# Patient Record
Sex: Male | Born: 1980
Health system: Southern US, Community
[De-identification: ages and names within clinical notes are randomized; demographics above are authoritative.]

## PROBLEM LIST (undated history)

## (undated) DIAGNOSIS — F418 Other specified anxiety disorders: Secondary | ICD-10-CM

## (undated) DIAGNOSIS — R3129 Other microscopic hematuria: Secondary | ICD-10-CM

## (undated) DIAGNOSIS — I493 Ventricular premature depolarization: Secondary | ICD-10-CM

## (undated) DIAGNOSIS — I10 Essential (primary) hypertension: Secondary | ICD-10-CM

## (undated) DIAGNOSIS — R002 Palpitations: Secondary | ICD-10-CM

## (undated) HISTORY — DX: Essential (primary) hypertension: I10

## (undated) HISTORY — DX: Palpitations: R00.2

## (undated) HISTORY — DX: Other microscopic hematuria: R31.29

## (undated) HISTORY — DX: Ventricular premature depolarization: I49.3

## (undated) HISTORY — DX: Other specified anxiety disorders: F41.8

---

## 2008-12-10 ENCOUNTER — Emergency Department (HOSPITAL_COMMUNITY): Admission: EM | Admit: 2008-12-10 | Discharge: 2008-12-10 | Payer: Self-pay | Admitting: Emergency Medicine

## 2009-07-24 ENCOUNTER — Emergency Department (HOSPITAL_COMMUNITY): Admission: EM | Admit: 2009-07-24 | Discharge: 2009-07-25 | Payer: Self-pay | Admitting: Emergency Medicine

## 2009-07-31 ENCOUNTER — Ambulatory Visit: Payer: Self-pay | Admitting: Internal Medicine

## 2009-07-31 ENCOUNTER — Inpatient Hospital Stay (HOSPITAL_COMMUNITY): Admission: EM | Admit: 2009-07-31 | Discharge: 2009-08-05 | Payer: Self-pay | Admitting: Emergency Medicine

## 2009-08-01 ENCOUNTER — Encounter (INDEPENDENT_AMBULATORY_CARE_PROVIDER_SITE_OTHER): Payer: Self-pay | Admitting: Internal Medicine

## 2009-08-05 ENCOUNTER — Encounter: Payer: Self-pay | Admitting: Internal Medicine

## 2009-09-09 ENCOUNTER — Encounter: Payer: Self-pay | Admitting: Internal Medicine

## 2009-09-09 ENCOUNTER — Ambulatory Visit: Payer: Self-pay | Admitting: Internal Medicine

## 2009-09-09 DIAGNOSIS — I10 Essential (primary) hypertension: Secondary | ICD-10-CM

## 2009-09-09 DIAGNOSIS — F341 Dysthymic disorder: Secondary | ICD-10-CM

## 2009-09-09 LAB — CONVERTED CEMR LAB
Bacteria, UA: NONE SEEN
Glucose, Urine, Semiquant: NEGATIVE
Ketones, urine, test strip: NEGATIVE
Leukocytes, UA: NEGATIVE
Nitrite: NEGATIVE
Nitrite: NEGATIVE
Protein, U semiquant: NEGATIVE
RBC / HPF: NONE SEEN (ref <3)
Specific Gravity, Urine: >=1.03
Specific Gravity, Urine: 1.028 (ref 1.005–1.0)
Urine Glucose: NEGATIVE mg/dL
WBC Urine, dipstick: NEGATIVE
pH: 5.5 (ref 5.0–8.0)

## 2009-09-10 DIAGNOSIS — R3129 Other microscopic hematuria: Secondary | ICD-10-CM

## 2009-11-05 ENCOUNTER — Telehealth: Payer: Self-pay | Admitting: *Deleted

## 2009-11-06 ENCOUNTER — Ambulatory Visit (HOSPITAL_COMMUNITY): Admission: RE | Admit: 2009-11-06 | Discharge: 2009-11-06 | Payer: Self-pay | Admitting: Infectious Diseases

## 2009-11-06 ENCOUNTER — Ambulatory Visit: Payer: Self-pay | Admitting: Infectious Diseases

## 2009-11-06 ENCOUNTER — Encounter: Payer: Self-pay | Admitting: Internal Medicine

## 2009-11-06 DIAGNOSIS — I4949 Other premature depolarization: Secondary | ICD-10-CM

## 2009-11-19 ENCOUNTER — Encounter: Payer: Self-pay | Admitting: Licensed Clinical Social Worker

## 2009-11-20 ENCOUNTER — Encounter: Payer: Self-pay | Admitting: Internal Medicine

## 2009-11-20 ENCOUNTER — Ambulatory Visit (HOSPITAL_COMMUNITY): Admission: RE | Admit: 2009-11-20 | Discharge: 2009-11-20 | Payer: Self-pay | Admitting: Internal Medicine

## 2009-11-20 ENCOUNTER — Ambulatory Visit: Payer: Self-pay | Admitting: Internal Medicine

## 2009-12-08 ENCOUNTER — Telehealth: Payer: Self-pay | Admitting: Internal Medicine

## 2010-03-02 ENCOUNTER — Encounter: Payer: Self-pay | Admitting: Internal Medicine

## 2010-03-02 ENCOUNTER — Ambulatory Visit: Payer: Self-pay | Admitting: Internal Medicine

## 2010-03-02 ENCOUNTER — Ambulatory Visit (HOSPITAL_COMMUNITY): Admission: RE | Admit: 2010-03-02 | Discharge: 2010-03-02 | Payer: Self-pay | Admitting: Internal Medicine

## 2010-03-02 DIAGNOSIS — K649 Unspecified hemorrhoids: Secondary | ICD-10-CM | POA: Insufficient documentation

## 2010-03-04 ENCOUNTER — Telehealth: Payer: Self-pay | Admitting: *Deleted

## 2010-03-10 ENCOUNTER — Encounter: Payer: Self-pay | Admitting: Internal Medicine

## 2010-03-10 ENCOUNTER — Ambulatory Visit: Payer: Self-pay | Admitting: Internal Medicine

## 2010-03-12 ENCOUNTER — Ambulatory Visit: Payer: Self-pay | Admitting: Internal Medicine

## 2010-03-31 ENCOUNTER — Telehealth: Payer: Self-pay | Admitting: Internal Medicine

## 2010-04-03 ENCOUNTER — Telehealth: Payer: Self-pay | Admitting: Internal Medicine

## 2010-04-16 ENCOUNTER — Ambulatory Visit: Payer: Self-pay | Admitting: Internal Medicine

## 2010-04-21 ENCOUNTER — Telehealth: Payer: Self-pay | Admitting: Internal Medicine

## 2010-05-04 ENCOUNTER — Telehealth: Payer: Self-pay | Admitting: Internal Medicine

## 2010-05-11 ENCOUNTER — Ambulatory Visit: Payer: Self-pay | Admitting: Internal Medicine

## 2010-12-29 NOTE — Assessment & Plan Note (Signed)
Summary: 4:15./PER CHECK OUT/SAF   Visit Type:  Follow-up Primary Provider:  Darnelle Maffucci MD   History of Present Illness: Vincent Griffith returns today for followup of his symptomatic PVC's.  He is a 30 yo man who has had PVC's which have been refractory to medical therapy in the past.  He has never had syncope and his LV function has been preserved in the past.  The patient was seen by me several weeks ago and at that time I recommended a trial of flecainide.  He has had no improvement in his symptoms and he has had bad dreams on flecainide and frequent urination.  No other complaints at this time except that he feels bad.  He states that he is too weak to do anything strenuous.  Current Medications (verified): 1)  Bisoprolol Fumarate 5 Mg Tabs (Bisoprolol Fumarate) .... Take 1/2 Tablet By Mouth Twice A Day 2)  Flecainide Acetate 50 Mg Tabs (Flecainide Acetate) .... Take 1/2 Tablet By Mouth Every 12 Hours  Allergies (verified): No Known Drug Allergies  Past History:  Past Medical History: Last updated: 12/31/2009 Current Problems:  INADEQUATE MATERIAL RESOURCES (ICD-V60.2) PREMATURE VENTRICULAR CONTRACTIONS, FREQUENT (ICD-427.69) MICROSCOPIC HEMATURIA (ICD-599.72) PALPITATIONS (ICD-785.1) ESSENTIAL HYPERTENSION, BENIGN (ICD-401.1) ANXIETY DEPRESSION (ICD-300.4) FAMILY HISTORY DIABETES 1ST DEGREE RELATIVE (ICD-V18.0)    Family History: Last updated: 09/09/2009 Family History Diabetes 1st degree relative Family History Hypertension  Review of Systems       The patient complains of chest pain and dyspnea on exertion.  The patient denies syncope and peripheral edema.    Vital Signs:  Patient profile:   30 year old male Height:      68.5 inches Weight:      202 pounds BMI:     30.38 Pulse rate:   80 / minute BP sitting:   120 / 70  (left arm)  Vitals Entered By: Laurance Flatten CMA (Apr 16, 2010 4:23 PM)  Physical Exam  General:  alert, well-developed, and cooperative to  examination.    Head:  normocephalic and atraumatic.   Eyes:  vision grossly intact, pupils equal, pupils round, and pupils reactive to light.   Mouth:  good dentition and pharynx pink and moist.   Neck:  No deformities, masses, or tenderness noted. Lungs:  normal respiratory effort, no accessory muscle use, normal breath sounds, no crackles, and no wheezes.  Heart:  normal rate, regular rhythm, no murmur, no gallop, and no rub.    Abdomen:  soft, non-tender, normal bowel sounds, no distention, no guarding, no rebound tenderness, no hepatomegaly, and no splenomegaly.    Msk:  no joint swelling, no joint warmth, and no redness over joints.    Pulses:  2+ Extremities:  No cyanosis, clubbing, edema  Neurologic:  alert & oriented X3, cranial nerves II-XII intact, strength normal in all extremities, sensation intact to light touch, and gait normal.       EKG  Procedure date:  04/16/2010  Findings:      Normal sinus rhythm with rate of: 69. PVC's noted which are quite narrow with a RBBB pattern along with a right inferior axis.    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  I have discussed the treatment options with him.  Catheter ablation could be currative and I have recommended this approach.  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  At this time the patient would like to try one additional medication which he stipulates needs to be taken only once daily.  Amiodarone is the only drug I can think of which might be effective and we will try this.  If he fails this medicine, then ablation would be recommended. The following medications were removed from the medication list:    Bisoprolol Fumarate 5 Mg Tabs (Bisoprolol fumarate) .Marland Kitchen...  Take 1/2 tablet by mouth twice a day    Flecainide Acetate 50 Mg Tabs (Flecainide acetate) .Marland Kitchen... Take 1/2 tablet by mouth every 12 hours His updated medication list for this problem includes:    Amiodarone Hcl 200 Mg Tabs (Amiodarone hcl) .Marland Kitchen... Take one tablet by mouth daily  Problem # 2:  ESSENTIAL HYPERTENSION, BENIGN (ICD-401.1) His blood pressure has been fairly well controlled.  Continue his current meds.  The following medications were removed from the medication list:    Bisoprolol Fumarate 5 Mg Tabs (Bisoprolol fumarate) .Marland Kitchen... Take 1/2 tablet by mouth twice a day  Patient Instructions: 1)  Your physician recommends that you schedule a follow-up appointment in: 3 months with Dr. Delorise Jackson 2)  Your physician has recommended you make the following change in your medication: Amiodarone 200mg  1 tablet daily.   STOP Flecainide and STOP Bisoprolol Prescriptions: AMIODARONE HCL 200 MG TABS (AMIODARONE HCL) Take one tablet by mouth daily  #30 x 60   Entered by:   Lisabeth Devoid RN   Authorized by:   Laren Boom, MD, [facility]   Signed by:   Lisabeth Devoid RN on 04/16/2010   Method used:   Print then Give to Patient   RxID:   [id number]

## 2010-12-29 NOTE — Progress Notes (Signed)
Summary: clarification/gp  Phone Note From Pharmacy   Caller: [facility] [address] Summary of Call: Fax from [facility] pharmacy needs clarification  for Cortomycin  suspension.  It states this med. is ear drops and wants to know if you still want pt. to have this for hemorrhoid pain.  Thanks Initial call taken by: Chinita Pester RN,  Apr 03, 2010 2:29 PM  Follow-up for Phone Call        obviously an error! I will correct the prescription! will resend. Follow-up by: Julaine Fusi  DO,  Apr 06, 2010 11:28 PM    New/Updated Medications: HYDROCORTISONE ACE-PRAMOXINE 2.5-1 % CREA (PRAMOXINE-HC) Apply to affected area twice daily as directed Prescriptions: HYDROCORTISONE ACE-PRAMOXINE 2.5-1 % CREA (PRAMOXINE-HC) Apply to affected area twice daily as directed  #1 tube x 2   Entered and Authorized by:   Julaine Fusi  DO   Signed by:   Julaine Fusi  DO on 04/06/2010   Method used:   Electronically to        [facility] [address] (retail)       [phone]       [place], [place]  [address]       Ph: [phone]       Fax: [phone]   RxID:   [id number]

## 2010-12-29 NOTE — Progress Notes (Signed)
Summary: Hemmorrhoid  Phone Note Call from Patient   Caller: Patient Summary of Call: Pt here says that the medication he received for his hemmorrhoids is not working.  Wants something called Neo-healer.  said that he has used this before.  OTC medications have not been helping.Angelina Ok RN  Mar 31, 2010 4:36 PM    Initial call taken by: Angelina Ok RN,  Mar 31, 2010 4:38 PM    New/Updated Medications: CORTOMYCIN 3.5-10000-1 SUSP [facility]) Use one susp daily as needed for hemmorhoid pain Prescriptions: CORTOMYCIN 3.5-10000-1 SUSP (NEOMYCIN-POLYMYXIN-HC) Use one susp daily as needed for hemmorhoid pain  #10 x 2   Entered and Authorized by:   Julaine Fusi  DO   Signed by:   Julaine Fusi  DO on 03/31/2010   Method used:   Electronically to        [facility] [address] (retail)       [phone]       [place], [place]  [address]       Ph: [phone]       Fax: [phone]   RxID:   [id number]

## 2010-12-29 NOTE — Progress Notes (Signed)
Summary: med refill/gp  Phone Note Refill Request Message from:  Patient on December 08, 2009 12:03 PM  Refills Requested: Medication #1:  ATENOLOL 50 MG TABS Take 1 tablet by mouth once a day.   Dosage confirmed as above?Dosage Confirmed   Supply Requested: 1 year  Method Requested: Electronic Initial call taken by: Chinita Pester RN,  December 08, 2009 12:03 PM    Prescriptions: ATENOLOL 50 MG TABS (ATENOLOL) Take 1 tablet by mouth once a day  #30 x 11   Entered and Authorized by:   Doneen Poisson MD   Signed by:   Doneen Poisson MD on 12/08/2009   Method used:   Electronically to        [facility] [address] (retail)       [phone]       [place], [place]  [address]       Ph: [phone]       Fax: [phone]   RxID:   [id number]

## 2010-12-29 NOTE — Progress Notes (Signed)
Summary: sided effect from med-GT  Phone Note Call from Patient Call back at Home Phone [phone]   Caller: Patient Reason for Call: Talk to Nurse Summary of Call: per pt calling, gt prescribe AMIODARONE HCL 200 MG TABS Take one tablet by mouth daily. pt stop taken meds b/c of sided effect. heart skipping beat.  Initial call taken by: Lorne Skeens,  May 04, 2010 4:42 PM  Follow-up for Phone Call        Will discuss with Dr Ladona Ridgel tomorrow.  Stopped Amiodarone 2 weeks ago, he feels ok, heart is skipping all the time, [place] Dennis Bast, RN, BSN  May 04, 2010 5:36 PM spoke with Dr Ladona Ridgel it's ok not to take and return to his country.  PVC's over a long period of time may cause his EF to decrease.  LMOM for pt Dennis Bast, RN, BSN  May 05, 2010 2:34 PM

## 2010-12-29 NOTE — Progress Notes (Signed)
Summary: med change/gp  Phone Note From Pharmacy   Caller: [facility] Summary of Call: [facility] does not have Anusol cream and wants to know if it can be substituted for Hydrocortisine 2.5% cream.  I asked Dr. Gilford Rile and he said it is o.k. [facility] pharmacy made awared. Initial call taken by: Chinita Pester RN,  March 04, 2010 2:53 PM

## 2010-12-29 NOTE — Assessment & Plan Note (Signed)
Summary: nep   Visit Type:  Follow-up Primary Provider:  Darnelle Maffucci MD   History of Present Illness: Vincent Griffith is referred today by Dr. Clent Ridges for evaluation of symptomatic palpitations secondary to frequent ventricular ectopy.  The patient is a pleasant 30 yo man from [place] who has lived in the [place] for 10 yrs.  He has had palpitations for several months.  He also suffers from depression.  He has been on multiple beta blockers in the past and has had problems with constipation.  The patient notes that his beta blockers improve but do not eliminate his symptoms.  He also has some fatigue and weakness.  He has never had syncope and there is not an exertional component.    Current Medications (verified): 1)  Bisoprolol Fumarate 5 Mg Tabs (Bisoprolol Fumarate) .... 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  Vital Signs:  Patient profile:   30 year old male Height:      68.5 inches Weight:      200 pounds BMI:     30.08 Pulse rate:   81 / minute BP sitting:   120 / 80  (left arm)  Vitals Entered By: Laurance Flatten CMA (March 12, 2010 11:37 AM)  Physical Exam  General:  alert, well-developed, and cooperative to examination.    Head:  normocephalic and atraumatic.   Eyes:  vision grossly intact, pupils equal, pupils round, and pupils reactive to light.   Mouth:  good dentition and pharynx pink and moist.   Neck:  No deformities, masses, or tenderness noted. Lungs:  normal  respiratory effort, no accessory muscle use, normal breath sounds, no crackles, and no wheezes.  Heart:  normal rate, regular rhythm, no murmur, no gallop, and no rub.    Abdomen:  soft, non-tender, normal bowel sounds, no distention, no guarding, no rebound tenderness, no hepatomegaly, and no splenomegaly.    Msk:  no joint swelling, no joint warmth, and no redness over joints.    Pulses:  2+ Extremities:  No cyanosis, clubbing, edema  Neurologic:  alert & oriented X3, cranial nerves II-XII intact, strength normal in all extremities, sensation intact to light touch, and gait normal.       EKG  Procedure date:  03/12/2010  Findings:      Normal sinus rhythm with rate of: 81.  PVC's noted.  Left axis deviation.    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  We may ultimately need more flecainide but will start him on low dose.  Once we know his dose requirements, will schedule an exercise test. His updated medication list for this problem includes:    Bisoprolol Fumarate 5 Mg Tabs (Bisoprolol fumarate) .Marland Kitchen... Take 1/2 tablet by mouth twice a day    Flecainide Acetate 50 Mg Tabs (Flecainide acetate) .Marland Kitchen... Take one tablet by mouth every 12 hours  Problem # 2:  PALPITATIONS (ICD-785.1) This is secondary to problem number one.  Will followup. His updated  medication list for this problem includes:    Bisoprolol Fumarate 5 Mg Tabs (Bisoprolol fumarate) .Marland Kitchen... Take 1/2 tablet by mouth twice a day    Flecainide Acetate 50 Mg Tabs (Flecainide acetate) .Marland Kitchen... Take one tablet by mouth every 12 hours  Problem # 3:  ESSENTIAL HYPERTENSION, BENIGN (ICD-401.1) He will continue his current meds and maintain a low sodium diet. His updated medication list for this problem includes:    Bisoprolol Fumarate 5 Mg Tabs (Bisoprolol fumarate) .Marland Kitchen... Take 1/2 tablet  by mouth twice a day  Patient Instructions: 1)  Your physician has recommended you make the following change in your medication: Stay on Bisoprolol 5mg  1/2  tablet twice daily, Start Flecainide 50mg  1 tablet twice daily. 2)  Your physician recommends that you schedule a follow-up appointment in: 4-6 weeks with Dr Ladona Ridgel. Prescriptions: BISOPROLOL FUMARATE 5 MG TABS (BISOPROLOL FUMARATE) Take 1/2 tablet by mouth twice a day  #30 x 11   Entered by:   Proofreader   Authorized by:   Laren Boom, MD, [facility]   Signed by:   Gypsy Balsam RN BSN on 03/12/2010   Method used:   Print then Give to Patient   RxID:   [id number] FLECAINIDE ACETATE 50 MG TABS (FLECAINIDE ACETATE) Take one tablet by mouth every 12 hours  #60 x 11   Entered by:   Proofreader   Authorized by:   Laren Boom, MD, [facility]   Signed by:   Gypsy Balsam RN BSN on 03/12/2010   Method used:   Print then Give to Patient   RxID:   [id number]

## 2010-12-29 NOTE — Assessment & Plan Note (Signed)
Summary: est-ck/fu/meds/cfb   Vital Signs:  Patient profile:   30 year old male Height:      68.5 inches (173.99 cm) Weight:      201.9 pounds (91.77 kg) BMI:     30.36 Temp:     98.4 degrees F (36.89 degrees C) oral Pulse rate:   68 / minute BP sitting:   105 / 67  (right arm) Cuff size:   large  Vitals Entered By: Cynda Familia Duncan Dull) (May 11, 2010 10:09 AM) CC: routine f/u, was recently seen in by Selena Batten, Depression Is Patient Diabetic? No Pain Assessment Patient in pain? no      Nutritional Status BMI of > 30 = obese  Have you ever been in a relationship where you felt threatened, hurt or afraid?No   Does patient need assistance? Functional Status Self care Ambulation Normal   Primary Care Provider:  Darnelle Maffucci MD  CC:  routine f/u, was recently seen in by [facility], and Depression.  History of Present Illness: This is a 30 year old male with past medical history of HTN, anxiety and recent onset PVC's.  He was hospitalized in 07/2009 for severe dyspnia and had a completely normal work up at that time.  Dyspnia was attributed to anxiety and he was started on buspirone and metroprol.  He was seen earlier this month in this clinic and at that time was complaining of palpitations.    Today he is here for a follow up and   1) c/o of ongoing hemorrhoidal pain, requests new medication for that.  2) pt was seen by dr. Ladona Ridgel and dr. Ladona Ridgel recommened ablation for the patient, and patient is asking me about the risks and benefits.   3) c/o anxiety and insomnia  Pt denies fever, chills, or any other complaints, and otherwise doing well.   Depression History:      The patient is having a depressed mood most of the day but denies diminished interest in his usual daily activities.  The patient denies significant weight loss, significant weight gain, insomnia, hypersomnia, psychomotor agitation, psychomotor retardation, fatigue (loss of energy),  feelings of worthlessness (guilt), impaired concentration (indecisiveness), and recurrent thoughts of death or suicide.        The patient denies that he has thought about ending his life.         Preventive Screening-Counseling & Management  Alcohol-Tobacco     Alcohol drinks/day: 0     Smoking Status: current     Smoking Cessation Counseling: yes     Packs/Day: 0.5  Current Medications (verified): 1)  Amiodarone Hcl 200 Mg Tabs (Amiodarone Hcl) .... Take One Tablet By Mouth Daily 2)  Ativan 1 Mg Tabs (Lorazepam) .... Take 1 Tablet By Mouth Two Times A Day As Needed For Anxiety. 3)  Hemorrhoidal Relief 0.25-3-85.5 % Supp (Pe-Shark Liver Oil-Cocoa Buttr) .... Apply 1 Suppository Per Rectum Twice Daily. 4)  Colace 100 Mg Caps (Docusate Sodium) .... Take 1 Tablet By Mouth Two Times A Day  Allergies (verified): No Known Drug Allergies  Social History: Packs/Day:  0.5  Review of Systems       Per HPI  Physical Exam  General:  alert, well-developed, and cooperative to examination.    Neck:  No deformities, masses, or tenderness noted. Lungs:  normal respiratory effort, no accessory muscle use, normal breath sounds, no crackles, and no wheezes.  Heart:  normal rate, regular rhythm, no murmur, no gallop, and no rub.  Abdomen:  soft, non-tender, normal bowel sounds, no distention, no guarding, no rebound tenderness, no hepatomegaly, and no splenomegaly.    Msk:  no joint swelling, no joint warmth, and no redness over joints.    Pulses:  2+ Extremities:  No cyanosis, clubbing, edema  Neurologic:  alert & oriented X3, cranial nerves II-XII intact, strength normal in all extremities, sensation intact to light touch, and gait normal.     Skin:   turgor normal and no rashes.  Psych:  Oriented X3, memory intact for recent and remote, normally interactive, and slightly anxious.     Impression & Recommendations:  Problem # 1:  PREMATURE VENTRICULAR CONTRACTIONS, FREQUENT  (ICD-427.69) Assessment Comment Only per Dr. Ladona Ridgel.  His updated medication list for this problem includes:    Amiodarone Hcl 200 Mg Tabs (Amiodarone hcl) .Marland Kitchen... Take one tablet by mouth daily    Bisoprolol Fumarate 5 Mg Tabs (Bisoprolol fumarate) .Marland Kitchen... Take 1/2 tablet by mouth twice daily.  Problem # 2:  HEMORRHOIDS (ICD-455.6) Assessment: Comment Only pain c/o of ongoing pain, will give supp for pain, and colace.   Problem # 3:  ESSENTIAL HYPERTENSION, BENIGN (ICD-401.1) Assessment: Comment Only  Well controlled on current treatment, No new changes made today, Will continue to monitor.   His updated medication list for this problem includes:    Bisoprolol Fumarate 5 Mg Tabs (Bisoprolol fumarate) .Marland Kitchen... Take 1/2 tablet by mouth twice daily.  Problem # 4:  ANXIETY DEPRESSION (ICD-300.4) Assessment: Comment Only will start trial of ativan.   Complete Medication List: 1)  Amiodarone Hcl 200 Mg Tabs (Amiodarone hcl) .... Take one tablet by mouth daily 2)  Ativan 1 Mg Tabs (Lorazepam) .... Take 1 tablet by mouth two times a day as needed for anxiety. 3)  Hemorrhoidal Relief 0.25-3-85.5 % Supp (Pe-shark liver oil-cocoa buttr) .... Apply 1 suppository per rectum twice daily. 4)  Colace 100 Mg Caps (Docusate sodium) .... Take 1 tablet by mouth two times a day 5)  Bisoprolol Fumarate 5 Mg Tabs (Bisoprolol fumarate) .... Take 1/2 tablet by mouth twice daily.  Patient Instructions: 1)  Please schedule a follow-up appointment in 3 months. Prescriptions: BISOPROLOL FUMARATE 5 MG TABS (BISOPROLOL FUMARATE) take 1/2 tablet by mouth twice daily.  #30 x 1   Entered and Authorized by:   Darnelle Maffucci MD   Signed by:   Darnelle Maffucci MD on 05/11/2010   Method used:   Print then Give to Patient   RxID:   [id number] ATIVAN 1 MG TABS (LORAZEPAM) Take 1 tablet by mouth two times a day as needed for anxiety.  #20 x 0   Entered and Authorized by:   Darnelle Maffucci MD   Signed by:   Darnelle Maffucci MD on 05/11/2010   Method used:   Print then Give to Patient   RxID:   [id number] COLACE 100 MG CAPS (DOCUSATE SODIUM) Take 1 tablet by mouth two times a day  #60 x 0   Entered and Authorized by:   Darnelle Maffucci MD   Signed by:   Darnelle Maffucci MD on 05/11/2010   Method used:   Print then Give to Patient   RxID:   [id number] HEMORRHOIDAL RELIEF 0.25-3-85.5 % SUPP (PE-SHARK LIVER OIL-COCOA BUTTR) apply 1 suppository per rectum twice daily.  #20 x 0   Entered and Authorized by:   Darnelle Maffucci MD   Signed by:   Darnelle Maffucci MD on 05/11/2010   Method used:   Print then Give to  Patient   RxID:   [id number] ATIVAN 1 MG TABS (LORAZEPAM) Take 1 tablet by mouth two times a day as needed for anxiety.  #20 x 0   Entered and Authorized by:   Darnelle Maffucci MD   Signed by:   Darnelle Maffucci MD on 05/11/2010   Method used:   Print then Give to Patient   RxID:   [id number]

## 2010-12-29 NOTE — Progress Notes (Signed)
Summary: question regarding meds -sided effect  Phone Note Call from Patient Call back at Home Phone [phone]   Refills Requested: Medication #1:  mouth daily. Caller: Patient Reason for Call: Talk to Nurse Summary of Call: per pt calling, pt on AMIODARONE HCL 200 MG TABS Take one tablet by . has question regarding side effect.  Initial call taken by: Lorne Skeens,  Apr 21, 2010 3:03 PM  Follow-up for Phone Call        has questions about the Amiodarone and its side effects.  Feels in four days of taking it is making him constipated.  He is going to continue to try and take and will call us if problems persist Dennis Bast, RN, BSN  Apr 21, 2010 3:48 PM

## 2010-12-29 NOTE — Assessment & Plan Note (Signed)
Summary: EST-REFILL ON MEDS/CH   Vital Signs:  Patient profile:   30 year old male Height:      68.5 inches (173.99 cm) Weight:      198.1 pounds (90.05 kg) BMI:     29.79 Temp:     97.9 degrees F (36.61 degrees C) oral Pulse rate:   75 / minute BP sitting:   127 / 74  (left arm) Cuff size:   regular  Vitals Entered By: Cynda Familia Duncan Dull) (March 02, 2010 4:02 PM) CC: problems taking buspirone 5mg  Is Patient Diabetic? No Pain Assessment Patient in pain? no      Nutritional Status BMI of 25 - 29 = overweight  Have you ever been in a relationship where you felt threatened, hurt or afraid?No   Does patient need assistance? Functional Status Self care Ambulation Normal   Primary Care Provider:  Darnelle Maffucci MD  CC:  problems taking buspirone 5mg .  History of Present Illness: This is a 30 year old male with past medical history of HTN, anxiety and recent onset PVC's.  He was hospitalized in 07/2009 for severe dyspnia and had a completely normal work up at that time.  Dyspnia was attributed to anxiety and he was started on buspirone and metroprol.  He was seen earlier this month in this clinic and at that time was complaining of palpitations.  Had mulitple PVC's on his EKG then and was switched from metoprolol to atenolol because of constipation associated with the metoprolol (?).  He is here today for:  He reports that he has been feeling generally well.  He still has symptomatic palpatations which he describes as "like a lizard in my chest."  He has had no syncope but does get dizzy with exertion like walking for a long time or eating a large meal.  He gets short of breath with these episodes, but not severely short of breath.  No chest pain.  He has no constipation.  Depression History:      The patient denies a depressed mood most of the day and a diminished interest in his usual daily activities.        Comments:  pt states his mood changes, but he is  "good".   Preventive Screening-Counseling & Management  Alcohol-Tobacco     Alcohol drinks/day: 0     Smoking Status: current     Smoking Cessation Counseling: yes     Packs/Day: 3  A DAYCIGS  Current Medications (verified): 1)  Buspirone Hcl 5 Mg Tabs (Buspirone Hcl) .... Take 1 Tablet By Mouth Two Times A Day 2)  Atenolol 100 Mg Tabs (Atenolol) .... Take 1 Tablet By Mouth Once A Day 3)  Anusol-Hc 2.5 % Crea (Hydrocortisone) .... Apply To Rectal Area Once Daily As Needed  Allergies (verified): No Known Drug Allergies  Review of Systems       per HPI  Physical Exam  General:  alert, well-developed, and cooperative to examination.    Lungs:  normal respiratory effort, no accessory muscle use, normal breath sounds, no crackles, and no wheezes.  Heart:  normal rate, regular rhythm, no murmur, no gallop, and no rub.    Abdomen:  soft, non-tender, normal bowel sounds, no distention, no guarding, no rebound tenderness, no hepatomegaly, and no splenomegaly.    Rectal:  no external abnormalities, no masses, no fissures, and external hemorrhoid(s).   Msk:  no joint swelling, no joint warmth, and no redness over joints.    Extremities:  No cyanosis, clubbing, edema  Neurologic:  alert & oriented X3, cranial nerves II-XII intact, strength normal in all extremities, sensation intact to light touch, and gait normal.     Skin:   turgor normal and no rashes.  Psych:  Oriented X3, memory intact for recent and remote, normally interactive, and slightly anxious.     Impression & Recommendations:  Problem # 1:  HEMORRHOIDS (ICD-455.6) Assessment New appear to be internal, will give anusol cream as needed.   Problem # 2:  PALPITATIONS (ICD-785.1) Will increase atenolol to 100 daily, patient will f/u with cardiology in 10 days for this.  EKG shows PVC's and Left axis.  His updated medication list for this problem includes:    Atenolol 100 Mg Tabs (Atenolol) .Marland Kitchen... Take 1 tablet by mouth  once a day  Orders: 12 Lead EKG (12 Lead EKG)  Problem # 3:  ESSENTIAL HYPERTENSION, BENIGN (ICD-401.1) Assessment: Unchanged well controlled, will increase atenolol for palpitation and will monitor.  His updated medication list for this problem includes:    Atenolol 100 Mg Tabs (Atenolol) .Marland Kitchen... Take 1 tablet by mouth once a day  Problem # 4:  ANXIETY DEPRESSION (ICD-300.4)  Complete Medication List: 1)  Buspirone Hcl 5 Mg Tabs (Buspirone hcl) .... Take 1 tablet by mouth two times a day 2)  Atenolol 100 Mg Tabs (Atenolol) .... Take 1 tablet by mouth once a day 3)  Anusol-hc 2.5 % Crea (Hydrocortisone) .... Apply to rectal area once daily as needed  Patient Instructions: 1)  Please schedule a follow-up appointment in 3 months. Prescriptions: ANUSOL-HC 2.5 % CREA (HYDROCORTISONE) apply to rectal area once daily as needed  #1 x 0   Entered and Authorized by:   Darnelle Maffucci MD   Signed by:   Darnelle Maffucci MD on 03/02/2010   Method used:   Electronically to        [facility] [address] (retail)       [phone]       [place], [place]  [address]       Ph: [phone]       Fax: [phone]   RxID:   [id number] ATENOLOL 100 MG TABS (ATENOLOL) Take 1 tablet by mouth once a day  #30 x 0   Entered and Authorized by:   Darnelle Maffucci MD   Signed by:   Darnelle Maffucci MD on 03/02/2010   Method used:   Electronically to        [facility] [address] (retail)       [phone]       [place], [place]  [address]       Ph: [phone]       Fax: [phone]   RxID:   [id number]    Prevention & Chronic Care Immunizations   Influenza vaccine: Not documented    Tetanus booster: Not documented    Pneumococcal vaccine: Not documented  Other Screening   Smoking status: current  (03/02/2010)   Smoking cessation counseling: yes  (03/02/2010)  Hypertension   Last Blood Pressure: 127 / 74   (03/02/2010)   Serum creatinine: Not documented   Serum potassium Not documented  Self-Management Support :   Personal Goals (by the next clinic visit) :      Personal blood pressure goal: 130/80  (03/02/2010)   Patient will work on the following items until the next clinic  visit to reach self-care goals:     Medications and monitoring: take my medicines every day  (03/02/2010)     Eating: eat foods that are low in salt, eat baked foods instead of fried foods  (03/02/2010)     Activity: take a 30 minute walk every day  (03/02/2010)    Hypertension self-management support: Pre-printed educational material, Written self-care plan, Resources for patients handout  (03/02/2010)   Hypertension self-care plan printed.      Resource handout printed.   Appended Document: EST-REFILL ON MEDS/CH Anusol cream and Atenolol Rxs. called to [facility] per pt.'s  request.

## 2010-12-29 NOTE — Miscellaneous (Signed)
  Patient c/o that anusol cream did not help with his symptoms. I gave him a script for anbesol topical cream for symptomatic relief.   Also he complained that his atenolol is making him feel unwell, therefore i have switched him to Bisoprolol 5mg .. Will monitor progress at next f/u in 3 months.   Prescriptions: BISOPROLOL FUMARATE 5 MG TABS (BISOPROLOL FUMARATE) Take 1 tablet by mouth once a day  #30 x 3   Entered and Authorized by:   Darnelle Maffucci MD   Signed by:   Darnelle Maffucci MD on 03/10/2010   Method used:   Print then Give to Patient   RxID:   [id number] ANBESOL 10 % GEL (BENZOCAINE) apply to affected area twice daily  #1 x 3   Entered and Authorized by:   Darnelle Maffucci MD   Signed by:   Darnelle Maffucci MD on 03/10/2010   Method used:   Print then Give to Patient   RxID:   [id number]

## 2011-02-04 ENCOUNTER — Encounter: Payer: Self-pay | Admitting: Internal Medicine

## 2011-03-05 LAB — POCT I-STAT, CHEM 8
BUN: 15 mg/dL (ref 6–23)
Creatinine, Ser: 1 mg/dL (ref 0.4–1.5)
Glucose, Bld: 93 mg/dL (ref 70–99)
Potassium: 4.3 mEq/L (ref 3.5–5.1)
Sodium: 141 mEq/L (ref 135–145)

## 2011-03-05 LAB — CBC
HCT: 45 % (ref 39.0–52.0)
HCT: 45 % (ref 39.0–52.0)
HCT: 47.2 % (ref 39.0–52.0)
Hemoglobin: 15.4 g/dL (ref 13.0–17.0)
Hemoglobin: 15.8 g/dL (ref 13.0–17.0)
Hemoglobin: 16.1 g/dL (ref 13.0–17.0)
MCHC: 34.2 g/dL (ref 30.0–36.0)
MCHC: 34.3 g/dL (ref 30.0–36.0)
MCHC: 34.3 g/dL (ref 30.0–36.0)
MCV: 85.8 fL (ref 78.0–100.0)
MCV: 86.2 fL (ref 78.0–100.0)
Platelets: 225 10*3/uL (ref 150–400)
Platelets: 228 10*3/uL (ref 150–400)
RBC: 5.22 MIL/uL (ref 4.22–5.81)
RDW: 12.6 % (ref 11.5–15.5)
RDW: 12.9 % (ref 11.5–15.5)
RDW: 13 % (ref 11.5–15.5)
WBC: 10.6 10*3/uL — ABNORMAL HIGH (ref 4.0–10.5)

## 2011-03-05 LAB — BLOOD GAS, ARTERIAL
Bicarbonate: 22.5 mEq/L (ref 20.0–24.0)
FIO2: 0.21 %
O2 Saturation: 98.4 %
TCO2: 23.7 mmol/L (ref 0–100)
pO2, Arterial: 108 mmHg — ABNORMAL HIGH (ref 80.0–100.0)

## 2011-03-05 LAB — DIFFERENTIAL
Eosinophils Absolute: 0.1 10*3/uL (ref 0.0–0.7)
Lymphocytes Relative: 34 % (ref 12–46)
Lymphs Abs: 2.6 10*3/uL (ref 0.7–4.0)
Monocytes Relative: 7 % (ref 3–12)
Neutrophils Relative %: 57 % (ref 43–77)

## 2011-03-05 LAB — URINE DRUGS OF ABUSE SCREEN W ALC, ROUTINE (REF LAB)
Amphetamine Screen, Ur: NEGATIVE
Barbiturate Quant, Ur: NEGATIVE
Benzodiazepines.: NEGATIVE
Creatinine,U: 398.2 mg/dL
Ethyl Alcohol: <10 mg/dL (ref <10)
Marijuana Metabolite: NEGATIVE
Opiate Screen, Urine: NEGATIVE

## 2011-03-05 LAB — BASIC METABOLIC PANEL
BUN: 14 mg/dL (ref 6–23)
CO2: 31 mEq/L (ref 19–32)
Chloride: 102 mEq/L (ref 96–112)
Chloride: 104 mEq/L (ref 96–112)
GFR calc non Af Amer: >60 mL/min (ref >60)
Glucose, Bld: 87 mg/dL (ref 70–99)
Potassium: 3.6 mEq/L (ref 3.5–5.1)
Potassium: 3.8 mEq/L (ref 3.5–5.1)
Sodium: 139 mEq/L (ref 135–145)

## 2011-03-05 LAB — TSH: TSH: 0.642 u[IU]/mL (ref 0.350–4.500)

## 2011-03-05 LAB — COMPREHENSIVE METABOLIC PANEL
ALT: 28 U/L (ref 0–53)
BUN: 12 mg/dL (ref 6–23)
CO2: 29 mEq/L (ref 19–32)
Calcium: 9.7 mg/dL (ref 8.4–10.5)
Creatinine, Ser: 0.92 mg/dL (ref 0.4–1.5)
GFR calc non Af Amer: >60 mL/min (ref >60)
Glucose, Bld: 91 mg/dL (ref 70–99)
Total Protein: 7.2 g/dL (ref 6.0–8.3)

## 2011-03-05 LAB — CARDIAC PANEL(CRET KIN+CKTOT+MB+TROPI)
Total CK: 76 U/L (ref 7–232)
Troponin I: 0.02 ng/mL (ref 0.00–0.06)

## 2011-03-06 LAB — POCT I-STAT, CHEM 8
Calcium, Ion: 1.01 mmol/L — ABNORMAL LOW (ref 1.12–1.32)
Chloride: 109 mEq/L (ref 96–112)
HCT: 48 % (ref 39.0–52.0)

## 2011-03-06 LAB — DIFFERENTIAL
Basophils Absolute: 0 10*3/uL (ref 0.0–0.1)
Basophils Relative: 1 % (ref 0–1)
Monocytes Absolute: 0.6 10*3/uL (ref 0.1–1.0)
Neutro Abs: 5 10*3/uL (ref 1.7–7.7)
Neutrophils Relative %: 56 % (ref 43–77)

## 2011-03-06 LAB — CBC
Hemoglobin: 16.2 g/dL (ref 13.0–17.0)
MCHC: 34.2 g/dL (ref 30.0–36.0)
RDW: 11.6 % (ref 11.5–15.5)

## 2011-03-06 LAB — POCT CARDIAC MARKERS: Troponin i, poc: <0.05 ng/mL (ref 0.00–0.09)

## 2011-03-15 LAB — CBC
HCT: 45.1 % (ref 39.0–52.0)
Hemoglobin: 15.4 g/dL (ref 13.0–17.0)
MCHC: 34.2 g/dL (ref 30.0–36.0)
MCV: 86.9 fL (ref 78.0–100.0)
RDW: 12.4 % (ref 11.5–15.5)

## 2011-03-15 LAB — DIFFERENTIAL
Basophils Relative: 0 % (ref 0–1)
Lymphs Abs: 2.5 10*3/uL (ref 0.7–4.0)
Monocytes Relative: 7 % (ref 3–12)
Neutro Abs: 5.5 10*3/uL (ref 1.7–7.7)
Neutrophils Relative %: 64 % (ref 43–77)

## 2011-03-15 LAB — COMPREHENSIVE METABOLIC PANEL
BUN: 10 mg/dL (ref 6–23)
Calcium: 9.6 mg/dL (ref 8.4–10.5)
GFR calc non Af Amer: >60 mL/min (ref >60)
Glucose, Bld: 115 mg/dL — ABNORMAL HIGH (ref 70–99)
Total Protein: 7.3 g/dL (ref 6.0–8.3)

## 2011-03-15 LAB — URINALYSIS, ROUTINE W REFLEX MICROSCOPIC
Bilirubin Urine: NEGATIVE
Glucose, UA: NEGATIVE mg/dL
Hgb urine dipstick: NEGATIVE
Ketones, ur: NEGATIVE mg/dL
Nitrite: NEGATIVE
pH: 7 (ref 5.0–8.0)

## 2011-03-15 LAB — POCT CARDIAC MARKERS
CKMB, poc: 1.1 ng/mL (ref 1.0–8.0)
Myoglobin, poc: 71 ng/mL (ref 12–200)

## 2013-04-14 ENCOUNTER — Emergency Department (HOSPITAL_COMMUNITY): Payer: Self-pay

## 2013-04-14 ENCOUNTER — Encounter (HOSPITAL_COMMUNITY): Payer: Self-pay | Admitting: Emergency Medicine

## 2013-04-14 ENCOUNTER — Emergency Department (HOSPITAL_COMMUNITY)
Admission: EM | Admit: 2013-04-14 | Discharge: 2013-04-14 | Disposition: A | Discharge status: Home / Self Care | Payer: Self-pay | Attending: Emergency Medicine | Admitting: Emergency Medicine

## 2013-04-14 DIAGNOSIS — K219 Gastro-esophageal reflux disease without esophagitis: Secondary | ICD-10-CM | POA: Insufficient documentation

## 2013-04-14 DIAGNOSIS — R197 Diarrhea, unspecified: Secondary | ICD-10-CM | POA: Insufficient documentation

## 2013-04-14 DIAGNOSIS — F341 Dysthymic disorder: Secondary | ICD-10-CM | POA: Insufficient documentation

## 2013-04-14 DIAGNOSIS — Z8679 Personal history of other diseases of the circulatory system: Secondary | ICD-10-CM | POA: Insufficient documentation

## 2013-04-14 DIAGNOSIS — R0789 Other chest pain: Secondary | ICD-10-CM | POA: Insufficient documentation

## 2013-04-14 DIAGNOSIS — R1084 Generalized abdominal pain: Secondary | ICD-10-CM | POA: Insufficient documentation

## 2013-04-14 DIAGNOSIS — F17200 Nicotine dependence, unspecified, uncomplicated: Secondary | ICD-10-CM | POA: Insufficient documentation

## 2013-04-14 DIAGNOSIS — R300 Dysuria: Secondary | ICD-10-CM | POA: Insufficient documentation

## 2013-04-14 DIAGNOSIS — Z87448 Personal history of other diseases of urinary system: Secondary | ICD-10-CM | POA: Insufficient documentation

## 2013-04-14 DIAGNOSIS — I10 Essential (primary) hypertension: Secondary | ICD-10-CM | POA: Insufficient documentation

## 2013-04-14 DIAGNOSIS — Z79899 Other long term (current) drug therapy: Secondary | ICD-10-CM | POA: Insufficient documentation

## 2013-04-14 LAB — BASIC METABOLIC PANEL
BUN: 15 mg/dL (ref 6–23)
CO2: 25 mEq/L (ref 19–32)
Calcium: 9.4 mg/dL (ref 8.4–10.5)
Creatinine, Ser: 1 mg/dL (ref 0.50–1.35)
Glucose, Bld: 181 mg/dL — ABNORMAL HIGH (ref 70–99)

## 2013-04-14 LAB — URINALYSIS, ROUTINE W REFLEX MICROSCOPIC
Bilirubin Urine: NEGATIVE
Glucose, UA: NEGATIVE mg/dL
Hgb urine dipstick: NEGATIVE
Ketones, ur: NEGATIVE mg/dL
pH: 5.5 (ref 5.0–8.0)

## 2013-04-14 LAB — CBC
MCH: 30.5 pg (ref 26.0–34.0)
MCV: 83.8 fL (ref 78.0–100.0)
Platelets: 270 10*3/uL (ref 150–400)
RBC: 5.31 MIL/uL (ref 4.22–5.81)

## 2013-04-14 MED ORDER — RANITIDINE HCL 150 MG PO TABS: 150.0000 mg | ORAL_TABLET | Freq: Two times a day (BID) | ORAL | Status: DC

## 2013-04-14 MED ORDER — ASPIRIN 325 MG PO TABS: 325.0000 mg | ORAL_TABLET | ORAL | Status: DC

## 2013-04-14 MED ORDER — NITROGLYCERIN 0.4 MG SL SUBL: 0.4000 mg | SUBLINGUAL_TABLET | SUBLINGUAL | Status: DC | PRN

## 2013-04-14 MED ORDER — SUCRALFATE 1 GM/10ML PO SUSP: 1.0000 g | Freq: Four times a day (QID) | ORAL | Status: DC

## 2013-04-14 NOTE — ED Notes (Signed)
PT. REPORTS INTERMITTENT LEFT CHEST PAIN RADIATING TO LEFT ARM FOR 1 YEAR , ALSO REPORTS DIARRHEA AND DYSURIA , SLIGHT SOB , NO NAUSEA OR DIAPHORESIS.

## 2013-04-14 NOTE — ED Provider Notes (Addendum)
History     CSN: [id number]  Arrival date & time 04/14/13  0055   First MD Initiated Contact with Patient 04/14/13 [phone]      Chief Complaint  Patient presents with  . Chest Pain  . Diarrhea  . Dysuria    (Consider location/radiation/quality/duration/timing/severity/associated sxs/prior treatment) Patient is a 32 y.o. male presenting with chest pain, diarrhea, and dysuria. The history is provided by the patient.  Chest Pain Pain location:  Substernal area Pain quality: aching and burning   Pain radiates to:  L shoulder Pain radiates to the back: no   Pain severity:  Moderate Onset quality:  Gradual Timing:  Intermittent Progression:  Resolved Chronicity:  Chronic (has been ongoing for 1 year) Context: not breathing, not eating, not lifting, no movement, not raising an arm and no trauma   Relieved by:  Nothing Worsened by:  Nothing tried Ineffective treatments:  None tried Associated symptoms: abdominal pain and heartburn   Associated symptoms: no back pain, no cough, no fever, no nausea, no palpitations, no shortness of breath, no syncope and not vomiting   Abdominal pain:    Location:  Generalized   Quality:  Cramping   Severity:  Moderate   Onset quality:  Gradual   Timing:  Intermittent   Progression:  Waxing and waning   Chronicity:  Recurrent (ongoing with diarrhea for the last 4 months) Risk factors: hypertension and smoking   Risk factors: no coronary artery disease, no diabetes mellitus, no high cholesterol and no surgery   Diarrhea Quality:  Watery Severity:  Moderate Onset quality:  Gradual Number of episodes:  3 episodes a day Duration:  4 months Timing:  Intermittent Progression:  Unchanged Relieved by:  Nothing Worsened by:  Nothing tried Ineffective treatments:  None tried Associated symptoms: abdominal pain   Associated symptoms: no fever and no vomiting   Risk factors: travel to endemic area   Risk factors: no recent antibiotic use and no sick  contacts   Risk factors comment:  Lived in [place] for the last 3 years and moved back 2 weeks ago Dysuria  Pertinent negatives include no nausea, no vomiting and no frequency.    Past Medical History  Diagnosis Date  . Premature ventricular contraction   . Hematuria, microscopic   . Heart palpitations   . Hypertension, essential, benign   . Anxiety associated with depression   . Premature ventricular contraction   . Microscopic hematuria     2010    History reviewed. No pertinent past surgical history.  Family History  Problem Relation Age of Onset  . Diabetes Other   . Hypertension Other     History  Substance Use Topics  . Smoking status: Current Every Day Smoker -- 0.50 packs/day    Types: Cigarettes  . Smokeless tobacco: Not on file  . Alcohol Use: No      Review of Systems  Constitutional: Negative for fever.  Respiratory: Negative for cough and shortness of breath.   Cardiovascular: Positive for chest pain. Negative for palpitations and syncope.  Gastrointestinal: Positive for heartburn, abdominal pain and diarrhea. Negative for nausea and vomiting.  Genitourinary: Positive for dysuria. Negative for frequency, scrotal swelling, genital sores, penile pain and testicular pain.  Musculoskeletal: Negative for back pain.  All other systems reviewed and are negative.    Allergies  Review of patient's allergies indicates no known allergies.  Home Medications   Current Outpatient Rx  Name  Route  Sig  Dispense  Refill  .  amiodarone (PACERONE) 200 MG tablet   Oral   Take 200 mg by mouth daily.           . bisoprolol (ZEBETA) 5 MG tablet   Oral   Take 2.5 mg by mouth as directed. Take 1/2 tablet tablet twice daily          . docusate sodium (COLACE) 100 MG capsule   Oral   Take 100 mg by mouth 2 (two) times daily.           Marland Kitchen LORazepam (ATIVAN) 1 MG tablet   Oral   Take 1 mg by mouth 2 (two) times daily as needed. For anixiety          . shark  liver oil-cocoa butter (PREPARATION H) 0.25-3-85.5 % suppository   Rectal   Place 1 suppository rectally 2 (two) times daily.             BP 149/83  Pulse 69  Temp(Src) 98.1 F (36.7 C) (Oral)  Resp 14  SpO2 99%  Physical Exam  Nursing note and vitals reviewed. Constitutional: He is oriented to person, place, and time. He appears well-developed and well-nourished. No distress.  HENT:  Head: Normocephalic and atraumatic.  Mouth/Throat: Oropharynx is clear and moist.  Eyes: Conjunctivae and EOM are normal. Pupils are equal, round, and reactive to light.  Neck: Normal range of motion. Neck supple.  Cardiovascular: Normal rate, regular rhythm and intact distal pulses.   No murmur heard. Pulmonary/Chest: Effort normal and breath sounds normal. No respiratory distress. He has no wheezes. He has no rales.  Abdominal: Soft. He exhibits no distension. There is no tenderness. There is no rebound and no guarding.  Genitourinary: Rectum normal and prostate normal.  Musculoskeletal: Normal range of motion. He exhibits no edema and no tenderness.  Neurological: He is alert and oriented to person, place, and time.  Skin: Skin is warm and dry. No rash noted. No erythema.  Psychiatric: He has a normal mood and affect. His behavior is normal.    ED Course  Procedures (including critical care time)  Labs Reviewed  CBC - Abnormal; Notable for the following:    MCHC 36.4 (*)    All other components within normal limits  BASIC METABOLIC PANEL - Abnormal; Notable for the following:    Glucose, Bld 181 (*)    All other components within normal limits  URINALYSIS, ROUTINE W REFLEX MICROSCOPIC - Abnormal; Notable for the following:    APPearance CLOUDY (*)    All other components within normal limits  STOOL CULTURE  OVA AND PARASITE EXAMINATION  PRO B NATRIURETIC PEPTIDE  GI PATHOGEN PANEL BY PCR, STOOL  POCT I-STAT TROPONIN I   Dg Chest 2 View  04/14/2013   *RADIOLOGY REPORT*  Clinical  Data: Chest pain.  Diarrhea and dysuria.  CHEST - 2 VIEW  Comparison: 08/01/2009  Findings: The heart size and mediastinal contours are within normal limits.  Both lungs are clear.  The visualized skeletal structures are unremarkable.  IMPRESSION: Negative exam.   Original Report Authenticated By: Signa Kell, M.D.    Date: 04/14/2013  Rate: 69  Rhythm: normal sinus rhythm  QRS Axis: normal  Intervals: normal  ST/T Wave abnormalities: normal  Conduction Disutrbances:none  Narrative Interpretation:   Old EKG Reviewed: none available    No diagnosis found.    MDM   Patient is here with multiple vague complaints. He recently moved back from [place] in 2 weeks ago. The symptoms he is  complaining of have been going on anywhere from a year to 4 months. His biggest complaint is of diarrhea daily without vomiting and no blood in his stool. He was seen by a physician in [place] and had stool tests done but no further testing was done at that time. On exam patient has no focal abdominal tenderness or concern for appendicitis, pancreatitis, diverticulitis or cholecystitis.   Given patient has recently traveled here from another country concern for possible parasitic infection causing diarrhea versus other bacterial causes. Also this could be related to irritable bowel or the food he was eating. Recommended that patient followup with gastroenterology for further care. Stool samples sent here.  BMP, CBC are within normal limits.  Also patient complaining of dysuria however urine is clear and he has no prostate swelling or tenderness.  Secondly patient is complaining of chest pain for the last one year that is not related to eating or exertion. He gets it in the substernal area and feels it in his left arm. He has a history of dysrhythmia however the only EKGs he produces here show sinus rhythm with PVCs.  He states he still taking Cipro all but denies taking amiodarone for the last 3 years. Patient's EKG  today is within normal limits without sign of dysrhythmia.  Troponin is negative and the patient's chest symptoms are most likely related to a GI source as he is also complaining of a lot of heartburn.  3 patient with Imodium and Zantac and have a followup with GI and [place] cardiology when necessary  Here for stool sample pt's stool was formed without diarrhea.    Gwyneth Sprout, MD 04/14/13 1610  Gwyneth Sprout, MD 04/14/13 [id number]  Gwyneth Sprout, MD 04/14/13 5409

## 2013-04-14 NOTE — ED Notes (Signed)
Pt dc to home.  Pt sts understanding to dc instructions.  Pt ambulatory to exit.. nadn.

## 2013-04-14 NOTE — ED Notes (Signed)
Stool sample provided by pt. Sample sent to lab.

## 2013-04-16 ENCOUNTER — Telehealth (HOSPITAL_COMMUNITY): Payer: Self-pay | Admitting: Emergency Medicine

## 2013-04-16 LAB — GI PATHOGEN PANEL BY PCR, STOOL
Campylobacter by PCR: NEGATIVE
Cryptosporidium by PCR: NEGATIVE
Norovirus GI/GII: NEGATIVE
Rotavirus A by PCR: NEGATIVE

## 2013-04-17 LAB — OVA AND PARASITE EXAMINATION

## 2013-04-18 ENCOUNTER — Telehealth (HOSPITAL_COMMUNITY): Payer: Self-pay | Admitting: Emergency Medicine

## 2013-04-18 LAB — STOOL CULTURE

## 2013-05-21 ENCOUNTER — Ambulatory Visit: Payer: No Typology Code available for payment source | Attending: Family Medicine | Admitting: Internal Medicine

## 2013-05-21 VITALS — BP 123/81 | HR 69 | Temp 99.1°F | Resp 18 | Ht 68.0 in | Wt 213.0 lb

## 2013-05-21 DIAGNOSIS — I10 Essential (primary) hypertension: Secondary | ICD-10-CM | POA: Insufficient documentation

## 2013-05-21 DIAGNOSIS — R739 Hyperglycemia, unspecified: Secondary | ICD-10-CM

## 2013-05-21 DIAGNOSIS — F341 Dysthymic disorder: Secondary | ICD-10-CM | POA: Insufficient documentation

## 2013-05-21 DIAGNOSIS — R7309 Other abnormal glucose: Secondary | ICD-10-CM

## 2013-05-21 LAB — POCT GLYCOSYLATED HEMOGLOBIN (HGB A1C): Hemoglobin A1C: 5.1

## 2013-05-21 MED ORDER — AMLODIPINE BESYLATE 10 MG PO TABS: 10.0000 mg | ORAL_TABLET | Freq: Every day | ORAL | Status: DC

## 2013-05-21 NOTE — Progress Notes (Signed)
Patient states that he has had diarrhea x 6 months with generalized pain; states that before the diarrhea's onset he had fatigue, dizziness and clamminess. States that now that he has diarrhea nothing seems to work to [employer] symptoms.  Also patient needs refill on hypertension medication; States he wants different hypertension medication, says that bisoprolol makes him feel weird. States that he has tride metoprolol and can not take that either.

## 2013-05-21 NOTE — Addendum Note (Signed)
Addended by: Earlie Lou on: 05/21/2013 03:57 PM   Modules accepted: Orders

## 2013-05-21 NOTE — Progress Notes (Signed)
Patient ID: Vincent Griffith, male   DOB: 25-Jul-1981, 32 y.o.   MRN: [id number]  CC: follow up  HPI: Pt is 32 yo male with multiple and complex medical history outlined below who comes in requesting change in BP medications. He explains that Bisoprolol is not working for him, he can not explain exactly why it bothers him but he feels more anxious when he takes it. He denies chest pain, shortness of breath, no abdominal or urinary concerns.   No Known Allergies Past Medical History  Diagnosis Date  . Premature ventricular contraction   . Hematuria, microscopic   . Heart palpitations   . Hypertension, essential, benign   . Anxiety associated with depression   . Premature ventricular contraction   . Microscopic hematuria     2010   Current Outpatient Prescriptions on File Prior to Visit  Medication Sig Dispense Refill  . amiodarone (PACERONE) 200 MG tablet Take 200 mg by mouth daily.        Marland Kitchen docusate sodium (COLACE) 100 MG capsule Take 100 mg by mouth 2 (two) times daily.        Marland Kitchen LORazepam (ATIVAN) 1 MG tablet Take 1 mg by mouth 2 (two) times daily as needed. For anixiety       . ranitidine (ZANTAC) 150 MG tablet Take 1 tablet (150 mg total) by mouth 2 (two) times daily.  60 tablet  0  . shark liver oil-cocoa butter (PREPARATION H) 0.25-3-85.5 % suppository Place 1 suppository rectally 2 (two) times daily.        . sucralfate (CARAFATE) 1 GM/10ML suspension Take 10 mLs (1 g total) by mouth 4 (four) times daily.  420 mL  0   No current facility-administered medications on file prior to visit.   Family History  Problem Relation Age of Onset  . Diabetes Other   . Hypertension Other    History   Social History  . Marital Status: Single    Spouse Name: N/A    Number of Children: N/A  . Years of Education: N/A   Occupational History  . unemployed    Social History Main Topics  . Smoking status: Current Every Day Smoker -- 0.50 packs/day    Types: Cigarettes  . Smokeless tobacco: Not  on file  . Alcohol Use: No  . Drug Use: No  . Sexually Active: Not on file   Other Topics Concern  . Not on file   Social History Narrative  . No narrative on file    Review of Systems  Constitutional: Negative for fever, chills, diaphoresis, activity change, appetite change and fatigue.  HENT: Negative for ear pain, nosebleeds, congestion, facial swelling, rhinorrhea, neck pain, neck stiffness and ear discharge.   Eyes: Negative for pain, discharge, redness, itching and visual disturbance.  Respiratory: Negative for cough, choking, chest tightness, shortness of breath, wheezing and stridor.   Cardiovascular: Negative for chest pain, palpitations and leg swelling.  Gastrointestinal: Negative for abdominal distention.  Genitourinary: Negative for dysuria, urgency, frequency, hematuria, flank pain, decreased urine volume, difficulty urinating and dyspareunia.  Musculoskeletal: Negative for back pain, joint swelling, arthralgias and gait problem.  Neurological: Negative for dizziness, tremors, seizures, syncope, facial asymmetry, speech difficulty, weakness, light-headedness, numbness and headaches.  Hematological: Negative for adenopathy. Does not bruise/bleed easily.  Psychiatric/Behavioral: Negative for hallucinations, behavioral problems, confusion, dysphoric mood, decreased concentration and agitation.    Objective:   Filed Vitals:   05/21/13 1516  BP: 123/81  Pulse: 69  Temp: 99.1 F (  37.3 C)  Resp: 18    Physical Exam  Constitutional: Appears well-developed and well-nourished. No distress.  CVS: RRR, S1/S2 +, no murmurs, no gallops, no carotid bruit.  Pulmonary: Effort and breath sounds normal, no stridor, rhonchi, wheezes, rales.  Abdominal: Soft. BS +,  no distension, tenderness, rebound or guarding.  Neuro: Alert. Normal reflexes, muscle tone coordination. No cranial nerve deficit. Psychiatric: Normal mood and affect. Behavior, judgment, thought content normal.    Lab Results  Component Value Date   WBC 8.9 04/14/2013   HGB 16.2 04/14/2013   HCT 44.5 04/14/2013   MCV 83.8 04/14/2013   PLT 270 04/14/2013   Lab Results  Component Value Date   CREATININE 1.00 04/14/2013   BUN 15 04/14/2013   NA 137 04/14/2013   K 4.1 04/14/2013   CL 101 04/14/2013   CO2 25 04/14/2013    No results found for this basename: HGBA1C   Lipid Panel  No results found for this basename: chol, trig, hdl, cholhdl, vldl, ldlcalc    Assessment and plan:   Patient Active Problem List   Diagnosis Date Noted  . ANXIETY DEPRESSION - clinically stable 09/09/2009  . ESSENTIAL HYPERTENSION, BENIGN - reasonable control, will discontinue Bisoprolol and will initiate Amlodipine for now, I have advised pt to continue checking BP regularly and to call us back if the numbers are higher > 140/90 09/09/2009  . PALPITATIONS - now resolved and stable  09/09/2009

## 2013-05-21 NOTE — Patient Instructions (Signed)

## 2013-07-23 ENCOUNTER — Encounter (HOSPITAL_COMMUNITY): Payer: Self-pay | Admitting: *Deleted

## 2013-07-23 ENCOUNTER — Encounter (HOSPITAL_COMMUNITY): Payer: Self-pay | Admitting: Pharmacy Technician

## 2013-08-20 ENCOUNTER — Other Ambulatory Visit: Payer: Self-pay | Admitting: Gastroenterology

## 2013-08-21 ENCOUNTER — Ambulatory Visit (HOSPITAL_COMMUNITY)
Admission: RE | Admit: 2013-08-21 | Discharge: 2013-08-21 | Disposition: A | Discharge status: Home / Self Care | Payer: No Typology Code available for payment source | Source: Ambulatory Visit | Attending: Gastroenterology | Admitting: Gastroenterology

## 2013-08-21 ENCOUNTER — Encounter (HOSPITAL_COMMUNITY): Payer: Self-pay | Admitting: Anesthesiology

## 2013-08-21 ENCOUNTER — Encounter (HOSPITAL_COMMUNITY): Payer: Self-pay | Admitting: *Deleted

## 2013-08-21 ENCOUNTER — Encounter (HOSPITAL_COMMUNITY)
Admission: RE | Disposition: A | Discharge status: Home / Self Care | Payer: Self-pay | Source: Ambulatory Visit | Attending: Gastroenterology

## 2013-08-21 DIAGNOSIS — K219 Gastro-esophageal reflux disease without esophagitis: Secondary | ICD-10-CM | POA: Insufficient documentation

## 2013-08-21 DIAGNOSIS — K2980 Duodenitis without bleeding: Secondary | ICD-10-CM | POA: Insufficient documentation

## 2013-08-21 DIAGNOSIS — K648 Other hemorrhoids: Secondary | ICD-10-CM | POA: Insufficient documentation

## 2013-08-21 DIAGNOSIS — R197 Diarrhea, unspecified: Secondary | ICD-10-CM | POA: Insufficient documentation

## 2013-08-21 DIAGNOSIS — K449 Diaphragmatic hernia without obstruction or gangrene: Secondary | ICD-10-CM | POA: Insufficient documentation

## 2013-08-21 HISTORY — PX: ESOPHAGOGASTRODUODENOSCOPY: SHX5428

## 2013-08-21 HISTORY — PX: COLONOSCOPY: SHX5424

## 2013-08-21 SURGERY — EGD (ESOPHAGOGASTRODUODENOSCOPY)
Anesthesia: Moderate Sedation

## 2013-08-21 MED ORDER — MIDAZOLAM HCL 10 MG/2ML IJ SOLN
INTRAMUSCULAR | Status: AC
  Filled 2013-08-21: qty 2

## 2013-08-21 MED ORDER — FENTANYL CITRATE 0.05 MG/ML IJ SOLN
INTRAMUSCULAR | Status: AC
  Filled 2013-08-21: qty 2

## 2013-08-21 MED ORDER — MIDAZOLAM HCL 2 MG/2ML IJ SOLN
INTRAMUSCULAR | Status: DC | PRN
  Administered 2013-08-21 (×6): 2 mg via INTRAVENOUS

## 2013-08-21 MED ORDER — FENTANYL CITRATE 0.05 MG/ML IJ SOLN
INTRAMUSCULAR | Status: DC | PRN
  Administered 2013-08-21 (×4): 25 ug via INTRAVENOUS

## 2013-08-21 MED ORDER — BUTAMBEN-TETRACAINE-BENZOCAINE 2-2-14 % EX AERO
INHALATION_SPRAY | CUTANEOUS | Status: DC | PRN
  Administered 2013-08-21: 2 via TOPICAL

## 2013-08-21 MED ORDER — SODIUM CHLORIDE 0.9 % IV SOLN
INTRAVENOUS | Status: DC
  Administered 2013-08-21: 500 mL via INTRAVENOUS

## 2013-08-21 MED ORDER — DIPHENHYDRAMINE HCL 50 MG/ML IJ SOLN
INTRAMUSCULAR | Status: AC
  Filled 2013-08-21: qty 1

## 2013-08-21 MED ORDER — DIPHENHYDRAMINE HCL 50 MG/ML IJ SOLN
INTRAMUSCULAR | Status: DC | PRN
  Administered 2013-08-21: 12.5 mg via INTRAVENOUS

## 2013-08-21 SURGICAL SUPPLY — 25 items

## 2013-08-21 NOTE — Op Note (Signed)
[facility] [address] [place], [address]   COLONOSCOPY PROCEDURE REPORT  PATIENT: Vincent Griffith, Delay  MR#: [id number] BIRTHDATE: December 27, 1980 , 31  yrs. old GENDER: Male ENDOSCOPIST: Charlott Rakes, MD REFERRED AO:[id number] Laural Benes, M.D. PROCEDURE DATE:  08/21/2013 PROCEDURE:   Colonoscopy with biopsy ASA CLASS:   Class II INDICATIONS:unexplained diarrhea. MEDICATIONS: Fentanyl 25 mcg IV, Versed 4 mg IV, and There was residual sedation effect present from prior procedure.  DESCRIPTION OF PROCEDURE:   After the risks benefits and alternatives of the procedure were thoroughly explained, informed consent was obtained.  The Pentax Ped Colon [id number]  endoscope was introduced through the anus and advanced to the cecum, which was identified by both the appendix and ileocecal valve , limited by No adverse events experienced.   The quality of the prep was good. . The instrument was then slowly withdrawn as the colon was fully examined.     FINDINGS:  Rectal exam unremarkable.  Pediatric colonoscope inserted into the colon and advanced to the cecum, where the appendiceal orifice and ileocecal valve were identified.    The terminal ileum was intubated and was normal in appearance. Biopsies were taken of the terminal ileum. On careful withdrawal of the colonoscope no mucosal abnormalities were seen. Random colon biopsies were taken to check for microscopic colitis.    Retroflexion revealed small internal hemorrhoids.  COMPLICATIONS: None  IMPRESSION:     Small internal hemorrhoids otherwise normal colonoscopy  RECOMMENDATIONS: F/U on path    ______________________________ eSigned:  Charlott Rakes, MD 08/21/2013 11:49 AM   IO:[id number], [place] MD

## 2013-08-21 NOTE — Interval H&P Note (Signed)
History and Physical Interval Note:  08/21/2013 10:49 AM  Vincent Griffith  has presented today for surgery, with the diagnosis of diarrhea/gerd  The various methods of treatment have been discussed with the patient and family. After consideration of risks, benefits and other options for treatment, the patient has consented to  Procedure(s): ESOPHAGOGASTRODUODENOSCOPY (EGD) (N/A) COLONOSCOPY (N/A) as a surgical intervention .  The patient's history has been reviewed, patient examined, no change in status, stable for surgery.  I have reviewed the patient's chart and labs.  Questions were answered to the patient's satisfaction.     Weslynn Ke C.

## 2013-08-21 NOTE — Addendum Note (Signed)
Addended by: Charlott Rakes on: 08/21/2013 08:40 AM   Modules accepted: Orders

## 2013-08-21 NOTE — Op Note (Signed)
[facility] [address] [place], [address]   ENDOSCOPY PROCEDURE REPORT  PATIENT: Vincent Griffith, Vincent Griffith  MR#: [id number] BIRTHDATE: February 03, 1981 , 31  yrs. old GENDER: Male  ENDOSCOPIST: Charlott Rakes, MD REFERRED [id number] Laural Benes, M.D.  PROCEDURE DATE:  08/21/2013 PROCEDURE:   EGD w/ biopsy ASA CLASS:   Class II INDICATIONS:Unexplained diarrhea.   History of esophageal reflux. MEDICATIONS: Cetacaine spray x 2, Fentanyl 75 mcg IV, and Versed 8 mg IV  TOPICAL ANESTHETIC:  DESCRIPTION OF PROCEDURE:   After the risks benefits and alternatives of the procedure were thoroughly explained, informed consent was obtained.  The     endoscope was introduced through the mouth and advanced to the second portion of the duodenum , limited by Without limitations.   The instrument was slowly withdrawn as the mucosa was fully examined.     FINDINGS: The endoscope was inserted into the oropharynx and esophagus was intubated.  The gastroesophageal junction was noted to be 40 cm from the incisors. Endoscope was advanced into the stomach, which revealed normal-appearing gastric mucosa.  The endoscope was advanced to the duodenal bulb and second portion of duodenum which were unremarkable. Biopsies were taken of the second portion of the duodenum to check for celiac sprue.  The endoscope was withdrawn back into the stomach and retroflexion revealed a small hiatal hernia.  COMPLICATIONS: None  ENDOSCOPIC IMPRESSION:     Small hiatal hernia otherwise normal EGD   RECOMMENDATIONS: F/U on biopsies to check for celiac sprue   REPEAT EXAM: N/A  _______________________________ Charlott Rakes, MD eSigned:  Charlott Rakes, MD 08/21/2013 11:44 AM    OZ:[id number], [place] MD  PATIENT NAME:  Shi, Blankenship MR#: [id number]

## 2013-08-21 NOTE — H&P (Signed)
  Date of Initial H&P: 08/16/13  History reviewed, patient examined, no change in status, stable for surgery.

## 2013-08-21 NOTE — Anesthesia Preprocedure Evaluation (Deleted)
Anesthesia Evaluation  Patient identified by MRN, date of birth, ID band Patient awake    Reviewed: Allergy & Precautions, H&P , NPO status , Patient's Chart, lab work & pertinent test results  Airway Mallampati: II TM Distance: >3 FB Neck ROM: full    Dental no notable dental hx. (+) Teeth Intact and Dental Advisory Given   Pulmonary neg pulmonary ROS,  breath sounds clear to auscultation  Pulmonary exam normal       Cardiovascular Exercise Tolerance: Good hypertension, Pt. on medications negative cardio ROS  Rhythm:regular Rate:Normal  Palpitations PVC   Neuro/Psych negative neurological ROS  negative psych ROS   GI/Hepatic negative GI ROS, Neg liver ROS,   Endo/Other  negative endocrine ROS  Renal/GU negative Renal ROS  negative genitourinary   Musculoskeletal   Abdominal   Peds  Hematology negative hematology ROS (+)   Anesthesia Other Findings   Reproductive/Obstetrics negative OB ROS                           Anesthesia Physical Anesthesia Plan  ASA: II  Anesthesia Plan: MAC   Post-op Pain Management:    Induction:   Airway Management Planned: Simple Face Mask  Additional Equipment:   Intra-op Plan:   Post-operative Plan:   Informed Consent: I have reviewed the patients History and Physical, chart, labs and discussed the procedure including the risks, benefits and alternatives for the proposed anesthesia with the patient or authorized representative who has indicated his/her understanding and acceptance.   Dental Advisory Given  Plan Discussed with: CRNA and Surgeon  Anesthesia Plan Comments:         Anesthesia Quick Evaluation

## 2013-08-22 ENCOUNTER — Encounter (HOSPITAL_COMMUNITY): Payer: Self-pay | Admitting: Gastroenterology

## 2013-08-23 ENCOUNTER — Encounter (HOSPITAL_COMMUNITY): Payer: Self-pay | Admitting: Emergency Medicine

## 2013-08-23 ENCOUNTER — Emergency Department (HOSPITAL_COMMUNITY): Payer: No Typology Code available for payment source

## 2013-08-23 ENCOUNTER — Other Ambulatory Visit: Payer: Self-pay

## 2013-08-23 ENCOUNTER — Emergency Department (HOSPITAL_COMMUNITY)
Admission: EM | Admit: 2013-08-23 | Discharge: 2013-08-23 | Disposition: A | Discharge status: Home / Self Care | Payer: No Typology Code available for payment source | Attending: Emergency Medicine | Admitting: Emergency Medicine

## 2013-08-23 DIAGNOSIS — F419 Anxiety disorder, unspecified: Secondary | ICD-10-CM

## 2013-08-23 DIAGNOSIS — F341 Dysthymic disorder: Secondary | ICD-10-CM | POA: Insufficient documentation

## 2013-08-23 DIAGNOSIS — R072 Precordial pain: Secondary | ICD-10-CM | POA: Insufficient documentation

## 2013-08-23 DIAGNOSIS — Z8679 Personal history of other diseases of the circulatory system: Secondary | ICD-10-CM | POA: Insufficient documentation

## 2013-08-23 DIAGNOSIS — F17200 Nicotine dependence, unspecified, uncomplicated: Secondary | ICD-10-CM | POA: Insufficient documentation

## 2013-08-23 DIAGNOSIS — Z79899 Other long term (current) drug therapy: Secondary | ICD-10-CM | POA: Insufficient documentation

## 2013-08-23 DIAGNOSIS — R197 Diarrhea, unspecified: Secondary | ICD-10-CM | POA: Insufficient documentation

## 2013-08-23 DIAGNOSIS — I4949 Other premature depolarization: Secondary | ICD-10-CM | POA: Insufficient documentation

## 2013-08-23 DIAGNOSIS — R079 Chest pain, unspecified: Secondary | ICD-10-CM

## 2013-08-23 DIAGNOSIS — I10 Essential (primary) hypertension: Secondary | ICD-10-CM | POA: Insufficient documentation

## 2013-08-23 DIAGNOSIS — K219 Gastro-esophageal reflux disease without esophagitis: Secondary | ICD-10-CM | POA: Insufficient documentation

## 2013-08-23 LAB — CBC
HCT: 42.7 % (ref 39.0–52.0)
Hemoglobin: 15.3 g/dL (ref 13.0–17.0)
MCV: 84.6 fL (ref 78.0–100.0)
Platelets: 280 10*3/uL (ref 150–400)
RBC: 5.05 MIL/uL (ref 4.22–5.81)
RDW: 12.3 % (ref 11.5–15.5)
WBC: 9.4 10*3/uL (ref 4.0–10.5)

## 2013-08-23 LAB — COMPREHENSIVE METABOLIC PANEL
Albumin: 3.9 g/dL (ref 3.5–5.2)
Alkaline Phosphatase: 80 U/L (ref 39–117)
BUN: 16 mg/dL (ref 6–23)
Calcium: 9.1 mg/dL (ref 8.4–10.5)
Chloride: 99 mEq/L (ref 96–112)
Creatinine, Ser: 0.81 mg/dL (ref 0.50–1.35)
GFR calc Af Amer: >90 mL/min (ref >90)
GFR calc non Af Amer: >90 mL/min (ref >90)
Glucose, Bld: 121 mg/dL — ABNORMAL HIGH (ref 70–99)
Potassium: 3.7 mEq/L (ref 3.5–5.1)
Total Bilirubin: 0.3 mg/dL (ref 0.3–1.2)

## 2013-08-23 LAB — POCT I-STAT TROPONIN I: Troponin i, poc: 0.01 ng/mL (ref 0.00–0.08)

## 2013-08-23 MED ORDER — LORAZEPAM 1 MG PO TABS
1.0000 mg | ORAL_TABLET | Freq: Once | ORAL | Status: DC
  Filled 2013-08-23: qty 1

## 2013-08-23 MED ORDER — BUSPIRONE HCL 5 MG PO TABS: 5.0000 mg | ORAL_TABLET | Freq: Three times a day (TID) | ORAL | Status: DC

## 2013-08-23 MED ORDER — SODIUM CHLORIDE 0.9 % IV SOLN: 1000.0000 mL | INTRAVENOUS | Status: DC

## 2013-08-23 MED ORDER — ASPIRIN 81 MG PO CHEW
324.0000 mg | CHEWABLE_TABLET | Freq: Once | ORAL | Status: AC
  Administered 2013-08-23: 324 mg via ORAL
  Filled 2013-08-23: qty 4

## 2013-08-23 NOTE — ED Notes (Signed)
Pt states he was seen 4 months ago for diarrhea that has been ongoing for 8 months but did not wish to get a work up for cp then. Pt had endoscopy/colonoscopy as follow up about diarrhea.

## 2013-08-23 NOTE — ED Notes (Signed)
Pt reports ongoing cp x 1 year. States pain occurs approx once a day lasting 5-6mins. Sometimes he gets diaphoretic and sob with cp. Hx mital valve regurgitation. Today pain is worse than normal states it feels like electrical shock and sometimes radiates to L arm.

## 2013-08-23 NOTE — ED Provider Notes (Signed)
CSN: [id number]     Arrival date & time 08/23/13  0415 History   First MD Initiated Contact with Patient 08/23/13 [phone]     Chief Complaint  Patient presents with  . Chest Pain    Patient is a 32 y.o. male presenting with chest pain. The history is provided by the patient.  Chest Pain Pain location:  Substernal area Pain quality: aching and sharp   Pain radiates to:  Does not radiate Pain severity:  Moderate Onset quality:  Unable to specify Duration:  48 months Timing:  Intermittent Chronicity:  Recurrent Context: at rest   Context: not breathing, not eating and no movement   Relieved by:  Nothing Associated symptoms: no abdominal pain, no fatigue, no fever, no nausea and no weakness   Risk factors comment:  Mitral valve regurgitation Pt is originally from [place].  He has had an evaluation previously in his home country.  Pt is also a [place] citizen and decided to come to the [place] for further evaluation.  He has multiple complaints including chronic diarrhea and GERD as well.  Pt states he has seen a cardiologist for this and has not been diagnosed with ACS.  At some point he had a stress test.  Tonight he had another episode that concerned him.  He states he gets these pains at least 3-4 times per week for the last several years.  Past Medical History  Diagnosis Date  . Premature ventricular contraction   . Hematuria, microscopic   . Heart palpitations   . Hypertension, essential, benign   . Anxiety associated with depression   . Premature ventricular contraction   . Microscopic hematuria     2010   Past Surgical History  Procedure Laterality Date  . Esophagogastroduodenoscopy N/A 08/21/2013    Procedure: ESOPHAGOGASTRODUODENOSCOPY (EGD);  Surgeon: Shirley Friar, MD;  Location: [facility];  Service: Endoscopy;  Laterality: N/A;  . Colonoscopy N/A 08/21/2013    Procedure: COLONOSCOPY;  Surgeon: Shirley Friar, MD;  Location: [facility];  Service: Endoscopy;  Laterality:  N/A;   Family History  Problem Relation Age of Onset  . Diabetes Other   . Hypertension Other    History  Substance Use Topics  . Smoking status: Current Every Day Smoker -- 0.50 packs/day for 12 years    Types: Cigarettes  . Smokeless tobacco: Not on file  . Alcohol Use: No    Review of Systems  Constitutional: Negative for fever and fatigue.  Cardiovascular: Positive for chest pain.  Gastrointestinal: Positive for diarrhea. Negative for nausea and abdominal pain.  Neurological: Negative for weakness.  All other systems reviewed and are negative.    Allergies  Review of patient's allergies indicates no active allergies.  Home Medications   Current Outpatient Rx  Name  Route  Sig  Dispense  Refill  . amLODipine (NORVASC) 10 MG tablet   Oral   Take 10 mg by mouth every morning.         . ranitidine (ZANTAC) 150 MG tablet   Oral   Take 1 tablet (150 mg total) by mouth 2 (two) times daily.   60 tablet   0   . busPIRone (BUSPAR) 5 MG tablet   Oral   Take 1 tablet (5 mg total) by mouth 3 (three) times daily.   30 tablet   1    BP 148/88  Pulse 94  Temp(Src) 98.1 F (36.7 C) (Oral)  Resp 12  Ht 5\' 6"  (1.676 m)  Wt 208 lb (94.348 kg)  BMI 33.59 kg/m2  SpO2 100% Physical Exam  Nursing note and vitals reviewed. Constitutional: He appears well-developed and well-nourished. No distress.  HENT:  Head: Normocephalic and atraumatic.  Right Ear: External ear normal.  Left Ear: External ear normal.  Eyes: Conjunctivae are normal. Right eye exhibits no discharge. Left eye exhibits no discharge. No scleral icterus.  Neck: Neck supple. No tracheal deviation present.  Cardiovascular: Normal rate, regular rhythm and intact distal pulses.   Pulses:      Radial pulses are 2+ on the right side, and 2+ on the left side.       Dorsalis pedis pulses are 2+ on the right side, and 2+ on the left side.  Pulmonary/Chest: Effort normal and breath sounds normal. No stridor. No  respiratory distress. He has no wheezes. He has no rales.  Abdominal: Soft. Bowel sounds are normal. He exhibits no distension. There is no tenderness. There is no rebound and no guarding.  Musculoskeletal: He exhibits no edema and no tenderness.  Neurological: He is alert. He has normal strength. No sensory deficit. Cranial nerve deficit:  no gross defecits noted. He exhibits normal muscle tone. He displays no seizure activity. Coordination normal.  Skin: Skin is warm and dry. No rash noted.  Psychiatric: His mood appears anxious.    ED Course  Procedures (including critical care time) EKG Normal sinus rhythm rate 94 Left axis deviation Normal intervals Normal ST-T wave Labs Review Labs Reviewed  COMPREHENSIVE METABOLIC PANEL - Abnormal; Notable for the following:    Sodium 134 (*)    Glucose, Bld 121 (*)    All other components within normal limits  CBC  POCT I-STAT TROPONIN I   Imaging Review Dg Chest 2 View  08/23/2013   CLINICAL DATA:  Chronic left chest pain, worse today.  EXAM: CHEST  2 VIEW  COMPARISON:  PA and lateral chest 04/14/2013.  FINDINGS: Heart size and mediastinal contours are within normal limits. Both lungs are clear. Visualized skeletal structures are unremarkable.  IMPRESSION: Negative chest.   Electronically Signed   By: Drusilla Kanner M.D.   On: 08/23/2013 06:04    MDM   1. Chest pain   2. Anxiety    Pt's symptoms are atypical for ACS.  They have been ongoing or over a year.  I suspect there is a strong anxiety component.  Discussed findings with patient.  Comfortable with discharge.  He requests an RX for buspar.    Celene Kras, MD 08/23/13 [phone]

## 2013-08-29 ENCOUNTER — Ambulatory Visit: Payer: No Typology Code available for payment source | Attending: Cardiology | Admitting: Cardiology

## 2013-09-05 ENCOUNTER — Ambulatory Visit: Payer: No Typology Code available for payment source | Attending: Cardiology | Admitting: Cardiology

## 2013-09-05 ENCOUNTER — Encounter: Payer: Self-pay | Admitting: Cardiology

## 2013-09-05 VITALS — BP 101/62 | HR 89 | Temp 98.7°F | Resp 18 | Wt 205.0 lb

## 2013-09-05 DIAGNOSIS — I10 Essential (primary) hypertension: Secondary | ICD-10-CM

## 2013-09-05 DIAGNOSIS — I4949 Other premature depolarization: Secondary | ICD-10-CM

## 2013-09-05 DIAGNOSIS — R0789 Other chest pain: Secondary | ICD-10-CM | POA: Insufficient documentation

## 2013-09-05 DIAGNOSIS — F341 Dysthymic disorder: Secondary | ICD-10-CM

## 2013-09-05 DIAGNOSIS — R002 Palpitations: Secondary | ICD-10-CM

## 2013-09-05 MED ORDER — ATENOLOL 25 MG PO TABS: 25.0000 mg | ORAL_TABLET | Freq: Two times a day (BID) | ORAL | Status: DC

## 2013-09-05 NOTE — Progress Notes (Signed)
Pt is here for intermittent chest pains Reports CP are daily and last 2-3 hours Hx of PVC and anxiety Pain increases when he lays on left side... Also will have palpitations Today he is asymptomatic Denies: SOB, CP, blurry vision, edema, HA Smokes 0.5 PPD. Alert w/no signs of acute distress.

## 2013-09-05 NOTE — Progress Notes (Signed)
HPI The patient is here today after visiting the emergency room with chest discomfort. EKG there reviewed was normal. Troponins were negative x2. Blood work unremarkable.  He has a history of frequent PVCs with funny feelings in his chest as well as palpitations. He also suffers from significant anxiety.  Echocardiogram in 2010 was completely normal. I reviewed this with him today.  He's been intolerant of bisoprolol and metoprolol the past with fatigue, decreased sexual drive, and shortness of breath without wheezing. He does not have COPD or asthma.  He denies orthopnea, PND or edema. He has no significant dyspnea on exertion or exertional chest discomfort.  His chest discomfort is of 2 types. One is clearly related to palpitations and increased heart rate. It is centered in his chest and radiates up into his neck. He also has a discomfort in the left side of his chest which is positional.  Past Medical History  Diagnosis Date  . Premature ventricular contraction   . Hematuria, microscopic   . Heart palpitations   . Hypertension, essential, benign   . Anxiety associated with depression   . Premature ventricular contraction   . Microscopic hematuria     2010    Current Outpatient Prescriptions  Medication Sig Dispense Refill  . amLODipine (NORVASC) 10 MG tablet Take 10 mg by mouth every morning.      Marland Kitchen atenolol (TENORMIN) 25 MG tablet Take 1 tablet (25 mg total) by mouth 2 (two) times daily.  60 tablet  3  . busPIRone (BUSPAR) 5 MG tablet Take 1 tablet (5 mg total) by mouth 3 (three) times daily.  30 tablet  1  . ranitidine (ZANTAC) 150 MG tablet Take 1 tablet (150 mg total) by mouth 2 (two) times daily.  60 tablet  0   No current facility-administered medications for this visit.    No Active Allergies  Family History  Problem Relation Age of Onset  . Diabetes Other   . Hypertension Other     History   Social History  . Marital Status: Single    Spouse Name: N/A   Number of Children: N/A  . Years of Education: N/A   Occupational History  . unemployed    Social History Main Topics  . Smoking status: Current Every Day Smoker -- 0.50 packs/day for 12 years    Types: Cigarettes  . Smokeless tobacco: Not on file  . Alcohol Use: No  . Drug Use: No  . Sexual Activity: Not on file   Other Topics Concern  . Not on file   Social History Narrative  . No narrative on file    ROS ALL NEGATIVE EXCEPT THOSE NOTED IN HPI  PE  General Appearance: well developed, well nourished in no acute distress, overweight HEENT: symmetrical face, PERRLA, good dentition  Neck: no JVD, thyromegaly, or adenopathy, trachea midline Chest: symmetric without deformity Cardiac: PMI non-displaced, RRR, normal S1, S2, no gallop or murmur Lung: clear to ausculation and percussion Vascular: all pulses full without bruits  Abdominal: nondistended, nontender, good bowel sounds, no HSM, no bruits Extremities: no cyanosis, clubbing or edema, no sign of DVT, no varicosities  Skin: normal color, no rashes Neuro: alert and oriented x 3, non-focal Pysch: Very anxious  EKG Reviewed from the ER. BMET    Component Value Date/Time   NA 134* 08/23/2013 0519   K 3.7 08/23/2013 0519   CL 99 08/23/2013 0519   CO2 22 08/23/2013 0519   GLUCOSE 121* 08/23/2013 1914  BUN 16 08/23/2013 0519   CREATININE 0.81 08/23/2013 0519   CALCIUM 9.1 08/23/2013 0519   GFRNONAA >90 08/23/2013 0519   GFRAA >90 08/23/2013 0519    Lipid Panel  No results found for this basename: chol, trig, hdl, cholhdl, vldl, ldlcalc    CBC    Component Value Date/Time   WBC 9.4 08/23/2013 0519   RBC 5.05 08/23/2013 0519   HGB 15.3 08/23/2013 0519   HCT 42.7 08/23/2013 0519   PLT 280 08/23/2013 0519   MCV 84.6 08/23/2013 0519   MCH 30.3 08/23/2013 0519   MCHC 35.8 08/23/2013 0519   RDW 12.3 08/23/2013 0519   LYMPHSABS 2.6 07/31/2009 1824   MONOABS 0.5 07/31/2009 1824   EOSABS 0.1 07/31/2009 1824   BASOSABS 0.0 07/31/2009  1824

## 2013-09-05 NOTE — Patient Instructions (Signed)
Follow in 6 weeks with Dr. Daleen Squibb

## 2013-09-05 NOTE — Assessment & Plan Note (Signed)
This is secondary to PVCs. I also think his chest discomfort and funny feelings in his chest are secondary to PVCs. He had a normal echocardiogram 4 years ago his exam is normal today. He has had an intolerance to bisoprolol metoprolol in the past. After long discussion, he would like to try a different beta blocker. We will start him on atenolol 25 mg twice a day. I'll see him back in 6 weeks. I would not be surprised if he cannot tolerate this. I spent a long time trying to explain to him that this is not serious and I am only treating him for symptoms. He may have to learn to put up with it. He left with a very clear understanding of this.

## 2013-09-05 NOTE — Assessment & Plan Note (Signed)
Good control

## 2013-09-05 NOTE — Assessment & Plan Note (Signed)
See discussion under palpitations. We'll try a different beta blocker will follow up in 6 weeks.

## 2013-09-05 NOTE — Progress Notes (Signed)
After pt was d/c and given instructions and started on atenolol 25mg  BID,  Pt reports he no longer wants to take atenolol and wants to remain on Amlodipine Notified Dr. Daleen Squibb and Dr. Daleen Squibb talked to him... Cancelled pt's appt w/Dr. Daleen Squibb in 6 weeks

## 2013-09-07 ENCOUNTER — Encounter: Payer: Self-pay | Admitting: Internal Medicine

## 2013-09-19 ENCOUNTER — Ambulatory Visit: Payer: No Typology Code available for payment source | Attending: Internal Medicine | Admitting: Internal Medicine

## 2013-09-19 ENCOUNTER — Encounter: Payer: Self-pay | Admitting: Internal Medicine

## 2013-09-19 VITALS — BP 134/79 | HR 87 | Temp 98.1°F | Resp 16 | Ht 68.0 in | Wt 204.0 lb

## 2013-09-19 DIAGNOSIS — R079 Chest pain, unspecified: Secondary | ICD-10-CM

## 2013-09-19 DIAGNOSIS — I10 Essential (primary) hypertension: Secondary | ICD-10-CM | POA: Insufficient documentation

## 2013-09-19 NOTE — Progress Notes (Signed)
Pt is here today for a f/u visit. Pt is asking for a second opinion about his heart issues PVC

## 2013-09-19 NOTE — Progress Notes (Signed)
Patient ID: Vincent Griffith, male   DOB: 1981/09/10, 32 y.o.   MRN: [id number]  CC: Followup for chest pain  HPI: 32 year old male with past medical history of hypertension and anxiety who presented to clinic for followup. Patient reports having on-and-off chest pains, lasting for about one hour to 2 hours when it comes on. Chest pain is mainly in the middle of the chest, nonradiating and not associated with palpitations or sweating. Patient did report that relaxation does help but sometimes it is very hard for him to relax.  No Known Allergies Past Medical History  Diagnosis Date  . Premature ventricular contraction   . Hematuria, microscopic   . Heart palpitations   . Hypertension, essential, benign   . Anxiety associated with depression   . Premature ventricular contraction   . Microscopic hematuria     2010   Current Outpatient Prescriptions on File Prior to Visit  Medication Sig Dispense Refill  . amLODipine (NORVASC) 10 MG tablet Take 10 mg by mouth every morning.      Marland Kitchen atenolol (TENORMIN) 25 MG tablet Take 1 tablet (25 mg total) by mouth 2 (two) times daily.  60 tablet  3  . busPIRone (BUSPAR) 5 MG tablet Take 1 tablet (5 mg total) by mouth 3 (three) times daily.  30 tablet  1  . ranitidine (ZANTAC) 150 MG tablet Take 1 tablet (150 mg total) by mouth 2 (two) times daily.  60 tablet  0   No current facility-administered medications on file prior to visit.   Family History  Problem Relation Age of Onset  . Diabetes Other   . Hypertension Other    History   Social History  . Marital Status: Single    Spouse Name: N/A    Number of Children: N/A  . Years of Education: N/A   Occupational History  . unemployed    Social History Main Topics  . Smoking status: Current Every Day Smoker -- 0.50 packs/day for 12 years    Types: Cigarettes  . Smokeless tobacco: Not on file  . Alcohol Use: No  . Drug Use: No  . Sexual Activity: Not on file   Other Topics Concern  . Not on  file   Social History Narrative  . No narrative on file    Review of Systems  Constitutional: Negative for fever, chills, diaphoresis, activity change, appetite change and fatigue.  HENT: Negative for ear pain, nosebleeds, congestion, facial swelling, rhinorrhea, neck pain, neck stiffness and ear discharge.   Eyes: Negative for pain, discharge, redness, itching and visual disturbance.  Respiratory: Negative for cough, choking, chest tightness, shortness of breath, wheezing and stridor.   Cardiovascular: Positive for chest pain, negative for palpitations and leg swelling.  Gastrointestinal: Negative for abdominal distention.  Genitourinary: Negative for dysuria, urgency, frequency, hematuria, flank pain, decreased urine volume, difficulty urinating and dyspareunia.  Musculoskeletal: Negative for back pain, joint swelling, arthralgias and gait problem.  Neurological: Negative for dizziness, tremors, seizures, syncope, facial asymmetry, speech difficulty, weakness, light-headedness, numbness and headaches.  Hematological: Negative for adenopathy. Does not bruise/bleed easily.  Psychiatric/Behavioral: Negative for hallucinations, behavioral problems, confusion, dysphoric mood, decreased concentration and agitation.    Objective:   Filed Vitals:   09/19/13 1452  BP: 134/79  Pulse: 87  Temp: 98.1 F (36.7 C)  Resp: 16    Physical Exam  Constitutional: Appears well-developed and well-nourished. No distress.  HENT: Normocephalic. External right and left ear normal. Oropharynx is clear and moist.  Eyes: Conjunctivae  and EOM are normal. PERRLA, no scleral icterus.  Neck: Normal ROM. Neck supple. No JVD. No tracheal deviation. No thyromegaly.  CVS: RRR, S1/S2 +, no murmurs, no gallops, no carotid bruit.  Pulmonary: Effort and breath sounds normal, no stridor, rhonchi, wheezes, rales.  Abdominal: Soft. BS +,  no distension, tenderness, rebound or guarding.  Musculoskeletal: Normal range of  motion. No edema and no tenderness.  Lymphadenopathy: No lymphadenopathy noted, cervical, inguinal. Neuro: Alert. Normal reflexes, muscle tone coordination. No cranial nerve deficit. Skin: Skin is warm and dry. No rash noted. Not diaphoretic. No erythema. No pallor.  Psychiatric: Normal mood and affect. Behavior, judgment, thought content normal.   Lab Results  Component Value Date   WBC 9.4 08/23/2013   HGB 15.3 08/23/2013   HCT 42.7 08/23/2013   MCV 84.6 08/23/2013   PLT 280 08/23/2013   Lab Results  Component Value Date   CREATININE 0.81 08/23/2013   BUN 16 08/23/2013   NA 134* 08/23/2013   K 3.7 08/23/2013   CL 99 08/23/2013   CO2 22 08/23/2013    Lab Results  Component Value Date   HGBA1C 5.1 05/21/2013   Lipid Panel  No results found for this basename: chol, trig, hdl, cholhdl, vldl, ldlcalc       Assessment and plan:   Patient Active Problem List   Diagnosis Date Noted  . Chest pain - Likely panic attacks. Rule out acute coronary syndrome. Obtain 2-D echo and 12-lead EKG  - Patient advised on proper nutrition, exercise, meditation  08/21/2013  . ESSENTIAL HYPERTENSION, BENIGN - Continue Norvasc 10 mg daily  09/09/2009

## 2013-09-19 NOTE — Patient Instructions (Signed)
Chest Pain (Nonspecific) °It is often hard to give a specific diagnosis for the cause of chest pain. There is always a chance that your pain could be related to something serious, such as a heart attack or a blood clot in the lungs. You need to follow up with your caregiver for further evaluation. °CAUSES  °· Heartburn. °· Pneumonia or bronchitis. °· Anxiety or stress. °· Inflammation around your heart (pericarditis) or lung (pleuritis or pleurisy). °· A blood clot in the lung. °· A collapsed lung (pneumothorax). It can develop suddenly on its own (spontaneous pneumothorax) or from injury (trauma) to the chest. °· Shingles infection (herpes zoster virus). °The chest wall is composed of bones, muscles, and cartilage. Any of these can be the source of the pain. °· The bones can be bruised by injury. °· The muscles or cartilage can be strained by coughing or overwork. °· The cartilage can be affected by inflammation and become sore (costochondritis). °DIAGNOSIS  °Lab tests or other studies, such as X-rays, electrocardiography, stress testing, or cardiac imaging, may be needed to find the cause of your pain.  °TREATMENT  °· Treatment depends on what may be causing your chest pain. Treatment may include: °· Acid blockers for heartburn. °· Anti-inflammatory medicine. °· Pain medicine for inflammatory conditions. °· Antibiotics if an infection is present. °· You may be advised to change lifestyle habits. This includes stopping smoking and avoiding alcohol, caffeine, and chocolate. °· You may be advised to keep your head raised (elevated) when sleeping. This reduces the chance of acid going backward from your stomach into your esophagus. °· Most of the time, nonspecific chest pain will improve within 2 to 3 days with rest and mild pain medicine. °HOME CARE INSTRUCTIONS  °· If antibiotics were prescribed, take your antibiotics as directed. Finish them even if you start to feel better. °· For the next few days, avoid physical  activities that bring on chest pain. Continue physical activities as directed. °· Do not smoke. °· Avoid drinking alcohol. °· Only take over-the-counter or prescription medicine for pain, discomfort, or fever as directed by your caregiver. °· Follow your caregiver's suggestions for further testing if your chest pain does not go away. °· Keep any follow-up appointments you made. If you do not go to an appointment, you could develop lasting (chronic) problems with pain. If there is any problem keeping an appointment, you must call to reschedule. °SEEK MEDICAL CARE IF:  °· You think you are having problems from the medicine you are taking. Read your medicine instructions carefully. °· Your chest pain does not go away, even after treatment. °· You develop a rash with blisters on your chest. °SEEK IMMEDIATE MEDICAL CARE IF:  °· You have increased chest pain or pain that spreads to your arm, neck, jaw, back, or abdomen. °· You develop shortness of breath, an increasing cough, or you are coughing up blood. °· You have severe back or abdominal pain, feel nauseous, or vomit. °· You develop severe weakness, fainting, or chills. °· You have a fever. °THIS IS AN EMERGENCY. Do not wait to see if the pain will go away. Get medical help at once. Call your local emergency services (911 in [place] Do not drive yourself to the hospital. °MAKE SURE YOU:  °· Understand these instructions. °· Will watch your condition. °· Will get help right away if you are not doing well or get worse. °Document Released: 08/25/2005 Document Revised: 02/07/2012 Document Reviewed: 06/20/2008 °ExitCare® Patient Information ©2014 [employer]   [employer]. ° °

## 2013-09-21 ENCOUNTER — Emergency Department (HOSPITAL_COMMUNITY)
Admission: EM | Admit: 2013-09-21 | Discharge: 2013-09-21 | Disposition: A | Discharge status: Home / Self Care | Payer: No Typology Code available for payment source | Attending: Emergency Medicine | Admitting: Emergency Medicine

## 2013-09-21 ENCOUNTER — Ambulatory Visit (HOSPITAL_COMMUNITY)
Admission: RE | Admit: 2013-09-21 | Discharge: 2013-09-21 | Disposition: A | Discharge status: Home / Self Care | Payer: No Typology Code available for payment source | Source: Ambulatory Visit | Attending: Internal Medicine | Admitting: Internal Medicine

## 2013-09-21 ENCOUNTER — Encounter (HOSPITAL_COMMUNITY): Payer: Self-pay | Admitting: Emergency Medicine

## 2013-09-21 DIAGNOSIS — I4949 Other premature depolarization: Secondary | ICD-10-CM

## 2013-09-21 DIAGNOSIS — T7840XA Allergy, unspecified, initial encounter: Secondary | ICD-10-CM

## 2013-09-21 DIAGNOSIS — K219 Gastro-esophageal reflux disease without esophagitis: Secondary | ICD-10-CM

## 2013-09-21 DIAGNOSIS — R079 Chest pain, unspecified: Secondary | ICD-10-CM | POA: Insufficient documentation

## 2013-09-21 DIAGNOSIS — L279 Dermatitis due to unspecified substance taken internally: Secondary | ICD-10-CM | POA: Insufficient documentation

## 2013-09-21 DIAGNOSIS — F17200 Nicotine dependence, unspecified, uncomplicated: Secondary | ICD-10-CM | POA: Insufficient documentation

## 2013-09-21 DIAGNOSIS — R072 Precordial pain: Secondary | ICD-10-CM

## 2013-09-21 DIAGNOSIS — Z8659 Personal history of other mental and behavioral disorders: Secondary | ICD-10-CM | POA: Insufficient documentation

## 2013-09-21 DIAGNOSIS — Z79899 Other long term (current) drug therapy: Secondary | ICD-10-CM | POA: Insufficient documentation

## 2013-09-21 DIAGNOSIS — I10 Essential (primary) hypertension: Secondary | ICD-10-CM | POA: Insufficient documentation

## 2013-09-21 DIAGNOSIS — Z87448 Personal history of other diseases of urinary system: Secondary | ICD-10-CM | POA: Insufficient documentation

## 2013-09-21 MED ORDER — DIPHENHYDRAMINE HCL 50 MG/ML IJ SOLN
25.0000 mg | Freq: Once | INTRAMUSCULAR | Status: AC
  Administered 2013-09-21: 25 mg via INTRAVENOUS
  Filled 2013-09-21: qty 1

## 2013-09-21 MED ORDER — METHYLPREDNISOLONE SODIUM SUCC 125 MG IJ SOLR
125.0000 mg | Freq: Once | INTRAMUSCULAR | Status: AC
  Administered 2013-09-21: 125 mg via INTRAVENOUS
  Filled 2013-09-21: qty 2

## 2013-09-21 MED ORDER — EPINEPHRINE 0.3 MG/0.3ML IJ SOAJ: 0.3000 mg | INTRAMUSCULAR | Status: AC | PRN

## 2013-09-21 MED ORDER — FAMOTIDINE 20 MG PO TABS
20.0000 mg | ORAL_TABLET | Freq: Once | ORAL | Status: AC
  Administered 2013-09-21: 20 mg via ORAL
  Filled 2013-09-21: qty 1

## 2013-09-21 MED ORDER — DIPHENHYDRAMINE HCL 25 MG PO CAPS: 25.0000 mg | ORAL_CAPSULE | Freq: Four times a day (QID) | ORAL | Status: AC | PRN

## 2013-09-21 MED ORDER — PREDNISONE 20 MG PO TABS: 60.0000 mg | ORAL_TABLET | Freq: Every day | ORAL | Status: DC

## 2013-09-21 NOTE — Progress Notes (Signed)
Echocardiogram 2D Echocardiogram has been performed.  Dorothey Baseman 09/21/2013, 12:44 PM

## 2013-09-21 NOTE — ED Provider Notes (Signed)
CSN: [id number]     Arrival date & time 09/21/13  0214 History   First MD Initiated Contact with Patient 09/21/13 0302     Chief Complaint  Patient presents with  . Urticaria   (Consider location/radiation/quality/duration/timing/severity/associated sxs/prior Treatment) HPI History provided by the patient. At home tonight, had the pizza flavored Pringles and shortly afterwards developed itchy rash to torso especially his back.  No known history of food allergies. No difficulty breathing. No wheezing. No facial swelling, lip swelling, tongue swelling or throat tightening. No history of similar reaction in the past. No known drug allergies. Has been taking Norvasc for some time without problems.  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 No history of anaphylaxis.  Symptoms mild to moderate. Past Medical History  Diagnosis Date  . Premature ventricular contraction   . Hematuria, microscopic   . Heart palpitations   . Hypertension, essential, benign   . Anxiety associated with depression   . Premature ventricular contraction   . Microscopic hematuria     2010   Past Surgical History  Procedure Laterality Date  . Esophagogastroduodenoscopy N/A 08/21/2013    Procedure: ESOPHAGOGASTRODUODENOSCOPY (EGD);  Surgeon: Shirley Friar, MD;  Location: [facility];  Service: Endoscopy;  Laterality: N/A;  . Colonoscopy N/A 08/21/2013    Procedure: COLONOSCOPY;  Surgeon: Shirley Friar, MD;  Location: [facility];  Service: Endoscopy;  Laterality: N/A;   Family History  Problem Relation Age of Onset  . Diabetes Other   . Hypertension Other    History  Substance Use Topics  . Smoking status: Current Every Day Smoker -- 0.50 packs/day for 12 years    Types: Cigarettes  . Smokeless tobacco: Not on file  . Alcohol Use: No    Review of Systems  Constitutional: Negative for fever and chills.  HENT: Negative for trouble swallowing and voice change.   Eyes: Negative for pain.   Respiratory: Negative for shortness of breath.   Cardiovascular: Negative for chest pain.  Gastrointestinal: Negative for vomiting and abdominal pain.  Genitourinary: Negative for dysuria.  Musculoskeletal: Negative for back pain, neck pain and neck stiffness.  Skin: Positive for rash.  Neurological: Negative for headaches.  All other systems reviewed and are negative.    Allergies  Other  Home Medications   Current Outpatient Rx  Name  Route  Sig  Dispense  Refill  . amLODipine (NORVASC) 10 MG tablet   Oral   Take 10 mg by mouth every morning.          BP 139/85  Pulse 89  Temp(Src) 97.7 F (36.5 C) (Oral)  Resp 20  SpO2 98% Physical Exam  Constitutional: He is oriented to person, place, and time. He appears well-developed and well-nourished.  HENT:  Head: Normocephalic and atraumatic.  Mouth/Throat: Oropharynx is clear and moist. No oropharyngeal exudate.  Eyes: EOM are normal. Pupils are equal, round, and reactive to light.  Neck: Neck supple.  Cardiovascular: Normal rate, regular rhythm and intact distal pulses.   Pulmonary/Chest: Effort normal and breath sounds normal. No stridor. No respiratory distress. He has no wheezes.  Musculoskeletal: Normal range of motion. He exhibits no edema.  Neurological: He is alert and oriented to person, place, and time.  Skin: Skin is warm and dry.  Wheal and flare urticarial, erythematous blanching rash to torso and extremities, sparing palms and the oropharynx    ED Course  Procedures (including critical care time)  IV Solu-Medrol. IV Benadryl. Pepcid provide  Recheck itching  improved and rash improving. No airway involvement.  Plan discharge home with prescription for five-day course of prednisone, continue Benadryl as needed. Patient encouraged to keep a food journal. EpiPen provided as needed for any severe reaction.  Followup primary care physician as needed. Return precautions verbalized is understood.  MDM   Diagnosis: Allergic reaction, no anaphylaxis or airway involvement Medications provided - symptomatically improving Vital signs nursing note and considered    Sunnie Nielsen, MD 09/21/13 [phone]

## 2013-09-21 NOTE — ED Notes (Signed)
Per Patient: Patient states he was eating Pizza flavored Pringles when he started to break out in hives. Pt has generalized hives throughout body. Pt c/o moderate to severe itching. Airway intact. Ax4, NAD.

## 2013-10-03 ENCOUNTER — Other Ambulatory Visit: Payer: Self-pay | Admitting: Internal Medicine

## 2013-10-03 MED ORDER — DILTIAZEM HCL ER COATED BEADS 120 MG PO TB24: 60.0000 mg | ORAL_TABLET | Freq: Every day | ORAL | Status: DC

## 2013-10-03 MED ORDER — DILTIAZEM HCL 30 MG PO TABS: 30.0000 mg | ORAL_TABLET | Freq: Two times a day (BID) | ORAL | Status: DC

## 2013-10-04 ENCOUNTER — Other Ambulatory Visit: Payer: Self-pay

## 2013-10-04 ENCOUNTER — Telehealth: Payer: Self-pay | Admitting: Emergency Medicine

## 2013-10-04 NOTE — Telephone Encounter (Signed)
Pt came into the office  Wanted to talk to Dr. Elisabeth Pigeon about Cardizem States that he only wants to take 1 tab daily and not 2  Per Dr. Elisabeth Pigeon, pt is to take one... Pt is non compliant Pt verbalized understanding.

## 2013-10-15 ENCOUNTER — Ambulatory Visit: Payer: No Typology Code available for payment source | Attending: Internal Medicine

## 2013-10-15 MED ORDER — METOPROLOL SUCCINATE ER 50 MG PO TB24: 50.0000 mg | ORAL_TABLET | Freq: Every day | ORAL | Status: DC

## 2013-10-15 NOTE — Progress Notes (Unsigned)
Pt comes in requesting prescription change Cardizem due to allergic reaction sx's since taking 2 weeks ago. C/o itchiness,lack of energy and sob. Spoke with Dr. Hyman Hopes New script given Toprol XL 50 mg daily

## 2013-10-24 ENCOUNTER — Ambulatory Visit: Payer: No Typology Code available for payment source

## 2013-11-06 ENCOUNTER — Encounter: Payer: Self-pay | Admitting: Cardiology

## 2013-11-15 ENCOUNTER — Encounter: Payer: Self-pay | Admitting: Internal Medicine

## 2013-11-15 ENCOUNTER — Ambulatory Visit: Payer: No Typology Code available for payment source | Attending: Internal Medicine | Admitting: Internal Medicine

## 2013-11-15 VITALS — BP 110/69 | HR 72 | Temp 98.6°F | Resp 16 | Ht 67.0 in | Wt 201.0 lb

## 2013-11-15 DIAGNOSIS — R002 Palpitations: Secondary | ICD-10-CM

## 2013-11-15 DIAGNOSIS — I10 Essential (primary) hypertension: Secondary | ICD-10-CM

## 2013-11-15 MED ORDER — AMLODIPINE BESYLATE 10 MG PO TABS: 10.0000 mg | ORAL_TABLET | Freq: Every day | ORAL | Status: DC

## 2013-11-15 NOTE — Progress Notes (Signed)
Patient ID: Vincent Griffith, male   DOB: 1980-12-27, 32 y.o.   MRN: [id number] Patient Demographics  Vincent Griffith, is a 32 y.o. male  NWG:[id number]  [id number]  DOB - Apr 27, 1981  Chief Complaint  Patient presents with  . Follow-up        Subjective:   Vincent Griffith is a 32 y.o. male here today for a follow up visit. Patient has history of PVCs, hypertension and anxiety. He has been on several medications but keeps coming back to change them because of side effects after reading about them on line. His recent prescription was metoprolol 50 mg tablet by mouth twice a day, he took for only one month and came in today to say he does not like the way metoprolol makes him feel, no specific symptom was verbalized. He said he wants to try something new. Of note he has been on Cardizem, carvedilol, amlodipine, propranolol,  Bisoprolol, atenolol, amiodarone, at different times, but he keeps coming back to request a change in medication each time because of "side effects". Sometimes he complains of frequent drug administration, 2 times a day instead of one time a day that he prefers. He was referred to Dr. wall cardiologist recently, and patient told me he will not see him again because Dr. wall said "he has no major medical issues", that he only advised him to continue his medications instead of changing the medication. He continues to smoke cigarette. He refuses to see a psychiatrist that was offered to him previously. Patient has No headache, No chest pain, No abdominal pain - No Nausea, No new weakness tingling or numbness, No Cough - SOB.  ALLERGIES: Allergies  Allergen Reactions  . Other Shortness Of Breath    pringles and strawberries     PAST MEDICAL HISTORY: Past Medical History  Diagnosis Date  . Premature ventricular contraction   . Hematuria, microscopic   . Heart palpitations   . Hypertension, essential, benign   . Anxiety associated with depression   . Premature ventricular  contraction   . Microscopic hematuria     2010    MEDICATIONS AT HOME: Prior to Admission medications   Medication Sig Start Date End Date Taking? Authorizing Provider  amLODipine (NORVASC) 10 MG tablet Take 1 tablet (10 mg total) by mouth daily. 11/15/13   Jeanann Lewandowsky, MD  diphenhydrAMINE (BENADRYL) 25 mg capsule Take 1 capsule (25 mg total) by mouth every 6 (six) hours as needed for itching. 09/21/13   Sunnie Nielsen, MD  EPINEPHrine (EPIPEN) 0.3 mg/0.3 mL SOAJ injection Inject 0.3 mLs (0.3 mg total) into the muscle as needed. 09/21/13   Sunnie Nielsen, MD  predniSONE (DELTASONE) 20 MG tablet Take 3 tablets (60 mg total) by mouth daily. 09/21/13   Sunnie Nielsen, MD     Objective:   Filed Vitals:   11/15/13 1258  BP: 110/69  Pulse: 72  Temp: 98.6 F (37 C)  TempSrc: Oral  Resp: 16  Height: 5\' 7"  (1.702 m)  Weight: 201 lb (91.173 kg)  SpO2: 98%    Exam General appearance : Awake, alert, not in any distress. Speech Clear. Not toxic looking, talkative HEENT: Atraumatic and Normocephalic, pupils equally reactive to light and accomodation Neck: supple, no JVD. No cervical lymphadenopathy.  Chest:Good air entry bilaterally, no added sounds  CVS: S1 S2 regular, no murmurs.  Abdomen: Bowel sounds present, Non tender and not distended with no gaurding, rigidity or rebound. Extremities: B/L Lower Ext shows no edema, both legs are warm to  touch Neurology: Awake alert, and oriented X 3, CN II-XII intact, Non focal Skin:No Rash Wounds:N/A   Data Review   CBC No results found for this basename: WBC, HGB, HCT, PLT, MCV, MCH, MCHC, RDW, NEUTRABS, LYMPHSABS, MONOABS, EOSABS, BASOSABS, BANDABS, BANDSABD,  in the last 168 hours  Chemistries   No results found for this basename: NA, K, CL, CO2, GLUCOSE, BUN, CREATININE, GFRCGP, CALCIUM, MG, AST, ALT, ALKPHOS, BILITOT,  in the last 168  hours ------------------------------------------------------------------------------------------------------------------ No results found for this basename: HGBA1C,  in the last 72 hours ------------------------------------------------------------------------------------------------------------------ No results found for this basename: CHOL, HDL, LDLCALC, TRIG, CHOLHDL, LDLDIRECT,  in the last 72 hours ------------------------------------------------------------------------------------------------------------------ No results found for this basename: TSH, T4TOTAL, FREET3, T3FREE, THYROIDAB,  in the last 72 hours ------------------------------------------------------------------------------------------------------------------ No results found for this basename: VITAMINB12, FOLATE, FERRITIN, TIBC, IRON, RETICCTPCT,  in the last 72 hours  Coagulation profile  No results found for this basename: INR, PROTIME,  in the last 168 hours    Assessment & Plan   1. Palpitations Heart rate is within normal limits Patient refuses all medications including beta blockers and Cardizem He said he wants some medications with no side effects and all of the beta blockers and Cardizem give him depression and chest pain  2. Essential hypertension, benign  - amLODipine (NORVASC) 10 MG tablet; Take 1 tablet (10 mg total) by mouth daily.  Dispense: 90 tablet; Refill: 3  Patient was counseled extensively about medications Patient was counseled on nutrition and exercise Patient was counseled on smoking cessation  Follow up in 3 months or when necessary   The patient was given clear instructions to go to ER or return to medical center if symptoms don't improve, worsen or new problems develop. The patient verbalized understanding. The patient was told to call to get lab results if they haven't heard anything in the next week.    Jeanann Lewandowsky, MD, MHA, FACP, FAAP [facility] [place], [place] [phone]   11/15/2013, 1:28 PM

## 2013-11-15 NOTE — Patient Instructions (Signed)
Generalized Anxiety Disorder Generalized anxiety disorder (GAD) is a mental disorder. It interferes with life functions, including relationships, work, and school. GAD is different from normal anxiety, which everyone experiences at some point in their lives in response to specific life events and activities. Normal anxiety actually helps [place] prepare for and get through these life events and activities. Normal anxiety goes away after the event or activity is over.  GAD causes anxiety that is not necessarily related to specific events or activities. It also causes excess anxiety in proportion to specific events or activities. The anxiety associated with GAD is also difficult to control. GAD can vary from mild to severe. People with severe GAD can have intense waves of anxiety with physical symptoms (panic attacks).  SYMPTOMS The anxiety and worry associated with GAD are difficult to control. This anxiety and worry are related to many life events and activities and also occur more days than not for 6 months or longer. People with GAD also have three or more of the following symptoms (one or more in children):  Restlessness.   Fatigue.  Difficulty concentrating.   Irritability.  Muscle tension.  Difficulty sleeping or unsatisfying sleep. DIAGNOSIS GAD is diagnosed through an assessment by your caregiver. Your caregiver will ask you questions aboutyour mood,physical symptoms, and events in your life. Your caregiver may ask you about your medical history and use of alcohol or drugs, including prescription medications. Your caregiver may also do a physical exam and blood tests. Certain medical conditions and the use of certain substances can cause symptoms similar to those associated with GAD. Your caregiver may refer you to a mental health specialist for further evaluation. TREATMENT The following therapies are usually used to treat GAD:   Medication Antidepressant medication usually is  prescribed for long-term daily control. Antianxiety medications may be added in severe cases, especially when panic attacks occur.   Talk therapy (psychotherapy) Certain types of talk therapy can be helpful in treating GAD by providing support, education, and guidance. A form of talk therapy called cognitive behavioral therapy can teach you healthy ways to think about and react to daily life events and activities.  Stress managementtechniques These include yoga, meditation, and exercise and can be very helpful when they are practiced regularly. A mental health specialist can help determine which treatment is best for you. Some people see improvement with one therapy. However, other people require a combination of therapies. Document Released: 03/12/2013 Document Reviewed: 03/12/2013 [facility] Patient Information 2014 [place], [place]. Amlodipine tablets What is this medicine? AMLODIPINE (am LOE di peen) is a calcium-channel blocker. It affects the amount of calcium found in your heart and muscle cells. This relaxes your blood vessels, which can reduce the amount of work the heart has to do. This medicine is used to lower high blood pressure. It is also used to prevent chest pain. This medicine may be used for other purposes; ask your health care provider or pharmacist if you have questions. COMMON BRAND NAME(S): Norvasc What should I tell my health care provider before I take this medicine? They need to know if you have any of these conditions: -heart problems like heart failure or aortic stenosis -liver disease -an unusual or allergic reaction to amlodipine, other medicines, foods, dyes, or preservatives -pregnant or trying to get pregnant -breast-feeding How should I use this medicine? Take this medicine by mouth with a glass of water. Follow the directions on the prescription label. Take your medicine at regular intervals. Do not take more  medicine than directed. Talk to your pediatrician  regarding the use of this medicine in children. Special care may be needed. This medicine has been used in children as young as 6. Persons over 80 years old may have a stronger reaction to this medicine and need smaller doses. Overdosage: If you think you have taken too much of this medicine contact a poison control center or emergency room at once. NOTE: This medicine is only for you. Do not share this medicine with others. What if I miss a dose? If you miss a dose, take it as soon as you can. If it is almost time for your next dose, take only that dose. Do not take double or extra doses. What may interact with this medicine? -herbal or dietary supplements -local or general anesthetics -medicines for high blood pressure -medicines for prostate problems -rifampin This list may not describe all possible interactions. Give your health care provider a list of all the medicines, herbs, non-prescription drugs, or dietary supplements you use. Also tell them if you smoke, drink alcohol, or use illegal drugs. Some items may interact with your medicine. What should I watch for while using this medicine? Visit your doctor or health care professional for regular check ups. Check your blood pressure and pulse rate regularly. Ask your health care professional what your blood pressure and pulse rate should be, and when you should contact him or her. This medicine may make you feel confused, dizzy or lightheaded. Do not drive, use machinery, or do anything that needs mental alertness until you know how this medicine affects you. To reduce the risk of dizzy or fainting spells, do not sit or stand up quickly, especially if you are an older patient. Avoid alcoholic drinks; they can make you more dizzy. Do not suddenly stop taking amlodipine. Ask your doctor or health care professional how you can gradually reduce the dose. What side effects may I notice from receiving this medicine? Side effects that you should  report to your doctor or health care professional as soon as possible: -allergic reactions like skin rash, itching or hives, swelling of the face, lips, or tongue -breathing problems -changes in vision or hearing -chest pain -fast, irregular heartbeat -swelling of legs or ankles Side effects that usually do not require medical attention (report to your doctor or health care professional if they continue or are bothersome): -dry mouth -facial flushing -nausea, vomiting -stomach gas, pain -tired, weak -trouble sleeping This list may not describe all possible side effects. Call your doctor for medical advice about side effects. You may report side effects to FDA at 1-800-FDA-1088. Where should I keep my medicine? Keep out of the reach of children. Store at room temperature between 59 and 86 degrees F (15 and 30 degrees C). Protect from light. Keep container tightly closed. Throw away any unused medicine after the expiration date. NOTE: This sheet is a summary. It may not cover all possible information. If you have questions about this medicine, talk to your doctor, pharmacist, or health care provider.  2014, Elsevier/Gold Standard. (2012-10-13 11:40:58)

## 2013-11-15 NOTE — Progress Notes (Signed)
Pt is here still having occasional chest pain. Pt reports that his BP medication is causing him to have headaches, dizziness, SOB, depression and makes him feel tired.

## 2013-11-16 ENCOUNTER — Encounter: Payer: Self-pay | Admitting: Internal Medicine

## 2013-11-27 ENCOUNTER — Encounter: Payer: Self-pay | Admitting: Internal Medicine

## 2013-12-14 ENCOUNTER — Telehealth: Payer: Self-pay | Admitting: Internal Medicine

## 2013-12-14 NOTE — Telephone Encounter (Signed)
Pt would like med change, says that amLODipine (NORVASC) 10 MG tablet causes his heart to beat faster.

## 2013-12-17 ENCOUNTER — Telehealth: Payer: Self-pay

## 2013-12-17 NOTE — Telephone Encounter (Signed)
This patient came in earlier today and spoke directly with Dr Hyman Hopesjegede We are not changing this medication at this time and he was told that on Friday  At his last appointment that changing his medication is dangerous

## 2013-12-17 NOTE — Telephone Encounter (Signed)
Patient showed up at clinic today wanting atenolol-hctz 50-12.5mg  Dr Hyman Hopesjegede tried to explain to him the dangers of switching blood pressure medication He has seen the cardiologist here He did not want to hear that Stated he will go back to his old dr at family [facility] because we will not switch  His medication We offered to refer him to a specialist but declined

## 2013-12-27 ENCOUNTER — Ambulatory Visit (INDEPENDENT_AMBULATORY_CARE_PROVIDER_SITE_OTHER): Payer: No Typology Code available for payment source | Admitting: Cardiology

## 2013-12-27 ENCOUNTER — Encounter: Payer: Self-pay | Admitting: Cardiology

## 2013-12-27 VITALS — BP 130/74 | HR 93 | Ht 67.0 in | Wt 202.1 lb

## 2013-12-27 DIAGNOSIS — R002 Palpitations: Secondary | ICD-10-CM

## 2013-12-27 DIAGNOSIS — R079 Chest pain, unspecified: Secondary | ICD-10-CM

## 2013-12-27 DIAGNOSIS — R42 Dizziness and giddiness: Secondary | ICD-10-CM

## 2013-12-27 MED ORDER — NEBIVOLOL HCL 5 MG PO TABS: 5.0000 mg | ORAL_TABLET | Freq: Every day | ORAL | Status: DC

## 2013-12-27 NOTE — Patient Instructions (Signed)
**Note De-Identified Marnette Perkins Obfuscation** Your physician has requested that you have an exercise tolerance test. For further information please visit [email or web address]. Please also follow instruction sheet, as given.  Your physician has recommended that you wear a 24 hour holter monitor. Holter monitors are medical devices that record the heart's electrical activity. Doctors most often use these monitors to diagnose arrhythmias. Arrhythmias are problems with the speed or rhythm of the heartbeat. The monitor is a small, portable device. You can wear one while you do your normal daily activities. This is usually used to diagnose what is causing palpitations/syncope (passing out).  Your physician has recommended you make the following change in your medication: start taking Bystolic 5 mg daily  Your physician recommends that you schedule a follow-up appointment in: After tests are complete

## 2013-12-27 NOTE — Progress Notes (Signed)
Patient ID: Vincent Griffith, male   DOB: [birth year], 33 y.o.   MRN: [id number]     Patient Name: Vincent Griffith Date of Encounter: 12/27/2013  Primary Care Provider:  Jeanann Lewandowsky, MD Primary Cardiologist:  Tobias Alexander, H   Problem List   Past Medical History  Diagnosis Date  . Premature ventricular contraction   . Hematuria, microscopic   . Heart palpitations   . Hypertension, essential, benign   . Anxiety associated with depression   . Premature ventricular contraction   . Microscopic hematuria     2010   Past Surgical History  Procedure Laterality Date  . Esophagogastroduodenoscopy N/A 08/21/2013    Procedure: ESOPHAGOGASTRODUODENOSCOPY (EGD);  Surgeon: Shirley Friar, MD;  Location: [facility];  Service: Endoscopy;  Laterality: N/A;  . Colonoscopy N/A 08/21/2013    Procedure: COLONOSCOPY;  Surgeon: Shirley Friar, MD;  Location: [facility];  Service: Endoscopy;  Laterality: N/A;    Allergies  Allergies  Allergen Reactions  . Other Shortness Of Breath    pringles and strawberries     HPI  Vincent Griffith is a 33 y.o. male with h/o hypertension, anxiety and depression. The patient works at [employer] and gets very anxious and is symptomatic with palpitations and dizziness. The patient was tried on antidepressants and anxiolytics without any improvement. The patient states that his symptoms improved with betablockers but he developed different side effects including constipation, hypoglycemia. Cardizem gave him chest pain radiating to his left arm.  He also complains of sharp, left sided stabbing pain for about an hour every night. No syncope. No FH of CAD.   Home Medications  Prior to Admission medications   Medication Sig Start Date End Date Taking? Authorizing Provider  amLODipine (NORVASC) 10 MG tablet Take 1 tablet (10 mg total) by mouth daily. 11/15/13  Yes Jeanann Lewandowsky, MD  diphenhydrAMINE (BENADRYL) 25 mg capsule Take 1 capsule (25 mg  total) by mouth every 6 (six) hours as needed for itching. 09/21/13  Yes Sunnie Nielsen, MD  EPINEPHrine (EPIPEN) 0.3 mg/0.3 mL SOAJ injection Inject 0.3 mLs (0.3 mg total) into the muscle as needed. 09/21/13  Yes Sunnie Nielsen, MD    Family History  Family History  Problem Relation Age of Onset  . Diabetes Other   . Hypertension Other     Social History  History   Social History  . Marital Status: Single    Spouse Name: N/A    Number of Children: N/A  . Years of Education: N/A   Occupational History  . unemployed    Social History Main Topics  . Smoking status: Current Every Day Smoker -- 0.50 packs/day for 12 years    Types: Cigarettes  . Smokeless tobacco: Not on file  . Alcohol Use: No  . Drug Use: No  . Sexual Activity: Not on file   Other Topics Concern  . Not on file   Social History Narrative  . No narrative on file     Review of Systems, as per HPI, otherwise negative General:  No chills, fever, night sweats or weight changes.  Cardiovascular:  No chest pain, dyspnea on exertion, edema, orthopnea, palpitations, paroxysmal nocturnal dyspnea. Dermatological: No rash, lesions/masses Respiratory: No cough, dyspnea Urologic: No hematuria, dysuria Abdominal:   No nausea, vomiting, diarrhea, bright red blood per rectum, melena, or hematemesis Neurologic:  No visual changes, wkns, changes in mental status. All other systems reviewed and are otherwise negative except as noted above.  Physical Exam  Blood  pressure 130/74, pulse 93, height 5\' 7"  (1.702 m), weight 202 lb 1.9 oz (91.681 kg).  General: Pleasant, NAD Psych: Normal affect. Neuro: Alert and oriented X 3. Moves all extremities spontaneously. HEENT: Normal  Neck: Supple without bruits or JVD. Lungs:  Resp regular and unlabored, CTA. Heart: RRR no s3, s4, or murmurs. Abdomen: Soft, non-tender, non-distended, BS + x 4.  Extremities: No clubbing, cyanosis or edema. DP/PT/Radials 2+ and equal  bilaterally.  Labs:  No results found for this basename: CKTOTAL, CKMB, TROPONINI,  in the last 72 hours Lab Results  Component Value Date   WBC 9.4 08/23/2013   HGB 15.3 08/23/2013   HCT 42.7 08/23/2013   MCV 84.6 08/23/2013   PLT 280 08/23/2013   No results found for this basename: NA, K, CL, CO2, BUN, CREATININE, CALCIUM, LABALBU, PROT, BILITOT, ALKPHOS, ALT, AST, GLUCOSE,  in the last 168 hours No results found for this basename: CHOL, HDL, LDLCALC, TRIG   Lab Results  Component Value Date   DDIMER  Value: <0.22        AT THE INHOUSE ESTABLISHED CUTOFF VALUE OF 0.48 ug/mL FEU, THIS ASSAY HAS BEEN DOCUMENTED IN THE LITERATURE TO HAVE A SENSITIVITY AND NEGATIVE PREDICTIVE VALUE OF AT LEAST 98 TO 99%.  THE TEST RESULT SHOULD BE CORRELATED WITH AN ASSESSMENT OF THE CLINICAL PROBABILITY OF DVT / VTE. 08/01/2009   No components found with this basename: POCBNP,   Accessory Clinical Findings  Echocardiogram - 09/21/2014 Left ventricle: The cavity size was normal. Wall thickness was normal. Systolic function was normal. The estimated ejection fraction was in the range of 55% to 60%. Wall motion was normal; there were no regional wall motion abnormalities.  ------------------------------------------------------------ Aortic valve: Structurally normal valve. Cusp separation was normal. Doppler: Transvalvular velocity was within the normal range. There was no stenosis. No regurgitation.  ------------------------------------------------------------ Aorta: The aorta was normal, not dilated, and non-diseased.  ------------------------------------------------------------ Mitral valve: Structurally normal valve. Leaflet separation was normal. Doppler: Transvalvular velocity was within the normal range. There was no evidence for stenosis. No regurgitation. Peak gradient: 2mm Hg (D).  ------------------------------------------------------------ Left atrium: The atrium was normal in  size.  ------------------------------------------------------------ Atrial septum: No defect or patent foramen ovale was identified.  ------------------------------------------------------------ Right ventricle: The cavity size was normal. Wall thickness was normal. Systolic function was normal.  ------------------------------------------------------------ Pulmonic valve: Structurally normal valve. Cusp separation was normal. Doppler: Transvalvular velocity was within the normal range. No regurgitation.  ------------------------------------------------------------ Tricuspid valve: Structurally normal valve. Leaflet separation was normal. Doppler: Transvalvular velocity was within the normal range. Trivial regurgitation.  ------------------------------------------------------------ Right atrium: The atrium was normal in size.  ------------------------------------------------------------ Pericardium: The pericardium was normal in appearance.  ------------------------------------------------------------ Systemic veins: Inferior vena cava: The vessel was normal in size; the respirophasic diameter changes were in the normal range (= 50%); findings are consistent with normal central venous pressure.  ECG - SR, LAD, LAFB, otherwise normal ECG    Assessment & Plan  1. Hypertension, Palpitations - seems like related to anxiety, many medication intolerances, we will start Bystolic 5 mg po daily (free samples for 2 weeks) and he will call us how he feels. The other option would be carvedilol.  We will start 24 hour Holter monitor to evaluate for arrhythmias.   2. Chest pain - very atypical, we will order an exercise treadmill stress test that will also evaluate BP at stress.   3. Lipids - we will check the day of stress test   Tobias AlexanderNELSON, Nikitia Asbill,  Rexene Edison, MD, [facility] 12/27/2013, 12:34 PM

## 2013-12-28 ENCOUNTER — Telehealth: Payer: Self-pay

## 2013-12-28 ENCOUNTER — Ambulatory Visit (HOSPITAL_COMMUNITY)
Admission: RE | Admit: 2013-12-28 | Discharge: 2013-12-28 | Disposition: A | Discharge status: Home / Self Care | Payer: No Typology Code available for payment source | Source: Ambulatory Visit | Attending: Cardiovascular Disease | Admitting: Cardiovascular Disease

## 2013-12-28 DIAGNOSIS — R079 Chest pain, unspecified: Secondary | ICD-10-CM

## 2013-12-28 NOTE — Telephone Encounter (Signed)
**Note De-Identified Nailah Luepke Obfuscation** Normal stress test, would you let him know? Thank you, Aris LotKatarina ----- Message ----- From: Yvetta CoderPamela A Phillips Sent: 12/28/2013 1:50 PM To: Lars MassonKatarina H Nelson, MD   The pt is advised and he verbalized understanding.

## 2013-12-31 ENCOUNTER — Encounter: Payer: Self-pay | Admitting: *Deleted

## 2013-12-31 ENCOUNTER — Encounter (INDEPENDENT_AMBULATORY_CARE_PROVIDER_SITE_OTHER): Payer: No Typology Code available for payment source

## 2013-12-31 DIAGNOSIS — R002 Palpitations: Secondary | ICD-10-CM

## 2013-12-31 DIAGNOSIS — R42 Dizziness and giddiness: Secondary | ICD-10-CM

## 2013-12-31 NOTE — Progress Notes (Signed)
Patient ID: Vincent Griffith, male   DOB: 1981/06/13, 33 y.o.   MRN: [id number] EVO 24 hour holter monitor applied to patient.

## 2014-01-02 ENCOUNTER — Telehealth: Payer: Self-pay

## 2014-01-02 ENCOUNTER — Other Ambulatory Visit: Payer: Self-pay | Admitting: *Deleted

## 2014-01-02 DIAGNOSIS — I509 Heart failure, unspecified: Secondary | ICD-10-CM

## 2014-01-02 MED ORDER — NEBIVOLOL HCL 5 MG PO TABS: 5.0000 mg | ORAL_TABLET | Freq: Every day | ORAL | Status: DC

## 2014-01-02 NOTE — Telephone Encounter (Signed)
Per Dr Delton SeeNelson the pt is advised that his holter monitor results were norma, he verbalized understanding.

## 2014-01-08 ENCOUNTER — Other Ambulatory Visit: Payer: Self-pay | Admitting: Internal Medicine

## 2014-01-08 DIAGNOSIS — I509 Heart failure, unspecified: Secondary | ICD-10-CM

## 2014-01-08 MED ORDER — NEBIVOLOL HCL 5 MG PO TABS: 5.0000 mg | ORAL_TABLET | Freq: Every day | ORAL | Status: DC

## 2014-01-21 ENCOUNTER — Ambulatory Visit: Payer: No Typology Code available for payment source | Admitting: Internal Medicine

## 2014-02-14 ENCOUNTER — Ambulatory Visit: Payer: No Typology Code available for payment source | Admitting: Internal Medicine

## 2014-04-05 ENCOUNTER — Other Ambulatory Visit: Payer: Self-pay | Admitting: Internal Medicine

## 2014-04-05 DIAGNOSIS — I509 Heart failure, unspecified: Secondary | ICD-10-CM

## 2014-04-05 MED ORDER — NEBIVOLOL HCL 5 MG PO TABS: 5.0000 mg | ORAL_TABLET | Freq: Every day | ORAL | Status: DC

## 2014-05-20 ENCOUNTER — Ambulatory Visit: Payer: No Typology Code available for payment source | Admitting: Internal Medicine

## 2014-09-06 ENCOUNTER — Other Ambulatory Visit: Payer: Self-pay | Admitting: Internal Medicine

## 2014-09-08 ENCOUNTER — Other Ambulatory Visit: Payer: Self-pay | Admitting: Internal Medicine

## 2014-12-11 ENCOUNTER — Other Ambulatory Visit: Payer: Self-pay | Admitting: Internal Medicine

## 2014-12-17 ENCOUNTER — Other Ambulatory Visit: Payer: Self-pay | Admitting: Internal Medicine

## 2015-01-17 ENCOUNTER — Other Ambulatory Visit: Payer: Self-pay | Admitting: Internal Medicine

## 2015-09-17 ENCOUNTER — Encounter: Payer: Self-pay | Admitting: Internal Medicine

## 2015-09-19 ENCOUNTER — Other Ambulatory Visit: Payer: Self-pay | Admitting: Internal Medicine

## 2015-09-19 ENCOUNTER — Encounter: Payer: Self-pay | Admitting: Internal Medicine

## 2016-01-16 ENCOUNTER — Telehealth: Payer: Self-pay | Admitting: *Deleted

## 2016-01-16 NOTE — Telephone Encounter (Signed)
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 Patient has not been seen in the office in 3 years. Patient states he will be returning to the [place] in March though patient is asking for a 3 month supply. MA notified patient of the appointment and refill policy. Patient states his BP medication keeps him from having SOB and provides him energy. MA explained to patient refills will be at the providers discretion.  Patient expressed his understanding and had no further questions at this time.

## 2016-10-04 ENCOUNTER — Ambulatory Visit: Payer: No Typology Code available for payment source

## 2016-10-14 ENCOUNTER — Ambulatory Visit: Payer: No Typology Code available for payment source | Admitting: Internal Medicine

## 2016-10-25 ENCOUNTER — Other Ambulatory Visit: Payer: Self-pay | Admitting: Internal Medicine

## 2016-10-25 DIAGNOSIS — I509 Heart failure, unspecified: Secondary | ICD-10-CM

## 2016-11-04 ENCOUNTER — Encounter: Payer: Self-pay | Admitting: Internal Medicine

## 2016-11-04 ENCOUNTER — Ambulatory Visit: Payer: Self-pay | Attending: Internal Medicine | Admitting: Internal Medicine

## 2016-11-04 ENCOUNTER — Encounter: Payer: Self-pay | Admitting: Licensed Clinical Social Worker

## 2016-11-04 VITALS — BP 119/67 | HR 76 | Temp 98.2°F | Resp 18 | Ht 67.0 in | Wt 219.8 lb

## 2016-11-04 DIAGNOSIS — M545 Low back pain, unspecified: Secondary | ICD-10-CM

## 2016-11-04 DIAGNOSIS — I10 Essential (primary) hypertension: Secondary | ICD-10-CM | POA: Insufficient documentation

## 2016-11-04 DIAGNOSIS — F331 Major depressive disorder, recurrent, moderate: Secondary | ICD-10-CM | POA: Insufficient documentation

## 2016-11-04 DIAGNOSIS — F411 Generalized anxiety disorder: Secondary | ICD-10-CM | POA: Insufficient documentation

## 2016-11-04 DIAGNOSIS — F17210 Nicotine dependence, cigarettes, uncomplicated: Secondary | ICD-10-CM | POA: Insufficient documentation

## 2016-11-04 DIAGNOSIS — Z23 Encounter for immunization: Secondary | ICD-10-CM | POA: Insufficient documentation

## 2016-11-04 DIAGNOSIS — G8929 Other chronic pain: Secondary | ICD-10-CM

## 2016-11-04 LAB — CBC WITH DIFFERENTIAL/PLATELET
BASOS PCT: 0 %
Basophils Absolute: 0 cells/uL (ref 0–200)
EOS PCT: 4 %
Eosinophils Absolute: 324 cells/uL (ref 15–500)
HEMATOCRIT: 45.3 % (ref 38.5–50.0)
Hemoglobin: 15.3 g/dL (ref 13.2–17.1)
LYMPHS PCT: 40 %
Lymphs Abs: 3240 cells/uL (ref 850–3900)
MCH: 29.8 pg (ref 27.0–33.0)
MCHC: 33.8 g/dL (ref 32.0–36.0)
MCV: 88.1 fL (ref 80.0–100.0)
MONO ABS: 567 {cells}/uL (ref 200–950)
MPV: 9.8 fL (ref 7.5–12.5)
Monocytes Relative: 7 %
Neutro Abs: 3969 cells/uL (ref 1500–7800)
Neutrophils Relative %: 49 %
PLATELETS: 281 10*3/uL (ref 140–400)
RBC: 5.14 MIL/uL (ref 4.20–5.80)
RDW: 13.2 % (ref 11.0–15.0)
WBC: 8.1 10*3/uL (ref 3.8–10.8)

## 2016-11-04 LAB — COMPLETE METABOLIC PANEL WITH GFR
ALT: 30 U/L (ref 9–46)
AST: 32 U/L (ref 10–40)
Albumin: 4.3 g/dL (ref 3.6–5.1)
Alkaline Phosphatase: 59 U/L (ref 40–115)
BUN: 15 mg/dL (ref 7–25)
CALCIUM: 9.4 mg/dL (ref 8.6–10.3)
CHLORIDE: 106 mmol/L (ref 98–110)
CO2: 24 mmol/L (ref 20–31)
Creat: 0.84 mg/dL (ref 0.60–1.35)
GFR, Est African American: >89 mL/min (ref >=60)
GFR, Est Non African American: >89 mL/min (ref >=60)
Glucose, Bld: 88 mg/dL (ref 65–99)
POTASSIUM: 4.4 mmol/L (ref 3.5–5.3)
Sodium: 139 mmol/L (ref 135–146)
Total Bilirubin: 0.4 mg/dL (ref 0.2–1.2)
Total Protein: 6.7 g/dL (ref 6.1–8.1)

## 2016-11-04 LAB — LIPID PANEL
CHOL/HDL RATIO: 8 ratio — AB (ref <5.0)
Cholesterol: 215 mg/dL — ABNORMAL HIGH (ref <200)
HDL: 27 mg/dL — ABNORMAL LOW (ref >40)
Triglycerides: 422 mg/dL — ABNORMAL HIGH (ref <150)

## 2016-11-04 LAB — TSH: TSH: 0.6 mIU/L (ref 0.40–4.50)

## 2016-11-04 MED ORDER — BUSPIRONE HCL 15 MG PO TABS: 15.0000 mg | ORAL_TABLET | Freq: Two times a day (BID) | ORAL | 3 refills | Status: DC

## 2016-11-04 MED ORDER — HYDROXYZINE HCL 25 MG PO TABS: 25.0000 mg | ORAL_TABLET | Freq: Three times a day (TID) | ORAL | 3 refills | Status: AC | PRN

## 2016-11-04 MED ORDER — NEBIVOLOL HCL 5 MG PO TABS: 5.0000 mg | ORAL_TABLET | Freq: Every day | ORAL | 3 refills | Status: DC

## 2016-11-04 MED FILL — busPIRone HCL 15 MG TABS: 15 | 30 days supply | Qty: 60 | Fill #0

## 2016-11-04 MED FILL — BYSTOLIC 5 MG TABLET: 5 | 30 days supply | Qty: 30 | Fill #0

## 2016-11-04 MED FILL — hydrOXYzine HCL 25 MG TABS: 25 | 30 days supply | Qty: 90 | Fill #0

## 2016-11-04 NOTE — Progress Notes (Signed)
Patient is here for BP and to Re-establish care  Patient complains of being afraid at home alone. Patient complains of intermittent back pain which causes his legs to lock in place.  Patient denies pain at this time.  Patient would like the flu vaccine today. Patient tolerated flu vaccine well today.

## 2016-11-04 NOTE — Progress Notes (Signed)
[facility] introduced [facility]  LCSWA disclosed that she received a consult from Dr. Hyman HopesJegede to address depression and anxiety.   Pt stated that he has been experiencing behavioral health concerns and agreed to scheduling an appointment to complete an assessment with LCSWA at a later date.

## 2016-11-04 NOTE — Patient Instructions (Signed)
Major Depressive Disorder, Adult Major depressive disorder (MDD) is a mental health condition. MDD often makes you feel sad, hopeless, or helpless. MDD can also cause symptoms in your body. MDD can affect your:  Work.  School.  Relationships.  Other normal activities. MDD can range from mild to very bad. It may occur once (single episode MDD). It can also occur many times (recurrent MDD). The main symptoms of MDD often include:  Feeling sad, depressed, or irritable most of the time.  Loss of interest. MDD symptoms also include:  Sleeping too much or too little.  Eating too much or too little.  A change in your weight.  Feeling tired (fatigue) or having low energy.  Feeling worthless.  Feeling guilty.  Trouble making decisions.  Trouble thinking clearly.  Thoughts of suicide or harming others.  Feeling weak.  Feeling agitated.  Keeping yourself from being around other people (isolation). Follow these instructions at home: Activity  Do these things as told by your doctor:  Go back to your normal activities.  Exercise regularly.  Spend time outdoors. Alcohol  Talk with your doctor about how alcohol can affect your antidepressant medicines.  Do not drink alcohol. Or, limit how much alcohol you drink.  This means no more than 1 drink a day for nonpregnant women and 2 drinks a day for men. One drink equals one of these:  12 oz of beer.  5 oz of wine.  1 oz of hard liquor. General instructions  Take over-the-counter and prescription medicines only as told by your doctor.  Eat a healthy diet.  Get plenty of sleep.  Find activities that you enjoy. Make time to do them.  Think about joining a support group. Your doctor may be able to suggest a group for you.  Keep all follow-up visits as told by your doctor. This is important. Where to find more information:  The [employer] on Mental Illness:  www.nami.org  U[employer] of Mental  Health:  http://www.maynard.net/www.nimh.nih.gov  National Suicide Prevention Lifeline:  330-504-09471-(601)260-0943. This is free, 24-hour help. Contact a doctor if:  Your symptoms get worse.  You have new symptoms. Get help right away if:  You self-harm.  You see, hear, taste, smell, or feel things that are not present (hallucinate). If you ever feel like you may hurt yourself or others, or have thoughts about taking your own life, get help right away. You can go to your nearest emergency department or call:  Your local emergency services (911 in the [place]  A suicide crisis helpline, such as the National Suicide Prevention Lifeline:  67132939901-(601)260-0943. This is open 24 hours a day. This information is not intended to replace advice given to you by your health care provider. Make sure you discuss any questions you have with your health care provider. Document Released: 10/27/2015 Document Revised: 08/01/2016 Document Reviewed: 08/01/2016 Elsevier Interactive Patient Education  2017 Elsevier Inc. Generalized Anxiety Disorder Generalized anxiety disorder (GAD) is a mental disorder. It interferes with life functions, including relationships, work, and school. GAD is different from normal anxiety, which everyone experiences at some point in their lives in response to specific life events and activities. Normal anxiety actually helps us prepare for and get through these life events and activities. Normal anxiety goes away after the event or activity is over.  GAD causes anxiety that is not necessarily related to specific events or activities. It also causes excess anxiety in proportion to specific events or activities. The anxiety associated with GAD  is also difficult to control. GAD can vary from mild to severe. People with severe GAD can have intense waves of anxiety with physical symptoms (panic attacks).  SYMPTOMS The anxiety and worry associated with GAD are difficult to control. This anxiety and worry are related to  many life events and activities and also occur more days than not for 6 months or longer. People with GAD also have three or more of the following symptoms (one or more in children):  Restlessness.   Fatigue.  Difficulty concentrating.   Irritability.  Muscle tension.  Difficulty sleeping or unsatisfying sleep. DIAGNOSIS GAD is diagnosed through an assessment by your health care provider. Your health care provider will ask you questions aboutyour mood,physical symptoms, and events in your life. Your health care provider may ask you about your medical history and use of alcohol or drugs, including prescription medicines. Your health care provider may also do a physical exam and blood tests. Certain medical conditions and the use of certain substances can cause symptoms similar to those associated with GAD. Your health care provider may refer you to a mental health specialist for further evaluation. TREATMENT The following therapies are usually used to treat GAD:   Medication. Antidepressant medication usually is prescribed for long-term daily control. Antianxiety medicines may be added in severe cases, especially when panic attacks occur.   Talk therapy (psychotherapy). Certain types of talk therapy can be helpful in treating GAD by providing support, education, and guidance. A form of talk therapy called cognitive behavioral therapy can teach you healthy ways to think about and react to daily life events and activities.  Stress managementtechniques. These include yoga, meditation, and exercise and can be very helpful when they are practiced regularly. A mental health specialist can help determine which treatment is best for you. Some people see improvement with one therapy. However, other people require a combination of therapies. This information is not intended to replace advice given to you by your health care provider. Make sure you discuss any questions you have with your health  care provider. Document Released: 03/12/2013 Document Revised: 12/06/2014 Document Reviewed: 03/12/2013 Elsevier Interactive Patient Education  2017 [employer]

## 2016-11-04 NOTE — Progress Notes (Signed)
Vincent LouisYahya Parodi, is a 35 y.o. male  ZOX:[id number]:[id number]  [id number]  DOB - 1981/04/15  CC:  Chief Complaint  Patient presents with  . Establish Care    htn      HPI: Vincent Griffith is a 35 y.o. male with history of hypertension, anxiety and depression here today to re- establish medical care. Patient is currently unemployed because of his general medical conditions and excessive anxiety. He is also afraid to live alone at home. He develops palpitation and dizziness attributable to his anxiety.He has tried several medications in the past, now ready to try again. Currently not on any medication. Patient travelled to [place] for a while, recently returned [place] the [place]. He had refused psychiatry referral in the past. Patient denies suicidal ideations or thoughts. Patient has No headache, No chest pain, No abdominal pain - No Nausea, No new weakness tingling or numbness, No Cough - SOB.   Allergies  Allergen Reactions  . Other Shortness Of Breath    pringles and strawberries    Past Medical History:  Diagnosis Date  . Anxiety associated with depression   . Heart palpitations   . Hematuria, microscopic   . Hypertension, essential, benign   . Microscopic hematuria    2010  . Premature ventricular contraction   . Premature ventricular contraction    Current Outpatient Prescriptions on File Prior to Visit  Medication Sig Dispense Refill  . diphenhydrAMINE (BENADRYL) 25 mg capsule Take 1 capsule (25 mg total) by mouth every 6 (six) hours as needed for itching. 30 capsule 0  . EPINEPHrine (EPIPEN) 0.3 mg/0.3 mL SOAJ injection Inject 0.3 mLs (0.3 mg total) into the muscle as needed. 1 Device 1   No current facility-administered medications on file prior to visit.    Family History  Problem Relation Age of Onset  . Diabetes Other   . Hypertension Other    Social History   Social History  . Marital status: Single    Spouse name: N/A  . Number of children: N/A  . Years of education: N/A    Occupational History  . unemployed    Social History Main Topics  . Smoking status: Current Every Day Smoker    Packs/day: 0.50    Years: 12.00    Types: Cigarettes  . Smokeless tobacco: Current User  . Alcohol use No  . Drug use: No  . Sexual activity: Not on file   Other Topics Concern  . Not on file   Social History Narrative  . No narrative on file   Review of Systems: Constitutional: Negative for fever, chills, diaphoresis, activity change, appetite change and fatigue. HENT: Negative for ear pain, nosebleeds, congestion, facial swelling, rhinorrhea, neck pain, neck stiffness and ear discharge.  Eyes: Negative for pain, discharge, redness, itching and visual disturbance. Respiratory: Negative for cough, choking, chest tightness, shortness of breath, wheezing and stridor.  Cardiovascular: Negative for chest pain, palpitations and leg swelling. Gastrointestinal: Negative for abdominal distention. Genitourinary: Negative for dysuria, urgency, frequency, hematuria, flank pain, decreased urine volume, difficulty urinating and dyspareunia.  Musculoskeletal: Negative for back pain, joint swelling, arthralgia and gait problem. Neurological: Negative for dizziness, tremors, seizures, syncope, facial asymmetry, speech difficulty, weakness, light-headedness, numbness and headaches.  Hematological: Negative for adenopathy. Does not bruise/bleed easily. Psychiatric/Behavioral: Negative for hallucinations, behavioral problems, confusion, dysphoric mood, decreased concentration and agitation.   Objective:   Vitals:   11/04/16 1113  BP: 119/67  Pulse: 76  Resp: 18  Temp: 98.2 F (36.8 C)  Physical Exam: Constitutional: Patient appears well-developed and well-nourished. No distress. HENT: Normocephalic, atraumatic, External right and left ear normal. Oropharynx is clear and moist.  Eyes: Conjunctivae and EOM are normal. PERRLA, no scleral icterus. Neck: Normal ROM. Neck supple.  No JVD. No tracheal deviation. No thyromegaly. CVS: RRR, S1/S2 +, no murmurs, no gallops, no carotid bruit.  Pulmonary: Effort and breath sounds normal, no stridor, rhonchi, wheezes, rales.  Abdominal: Soft. BS +, no distension, tenderness, rebound or guarding.  Musculoskeletal: Normal range of motion. No edema and no tenderness.  Lymphadenopathy: No lymphadenopathy noted, cervical, inguinal or axillary Neuro: Alert. Normal reflexes, muscle tone coordination. No cranial nerve deficit. Skin: Skin is warm and dry. No rash noted. Not diaphoretic. No erythema. No pallor. Psychiatric: Normal mood and affect. Behavior, judgment, thought content normal.  Lab Results  Component Value Date   WBC 9.4 08/23/2013   HGB 15.3 08/23/2013   HCT 42.7 08/23/2013   MCV 84.6 08/23/2013   PLT 280 08/23/2013   Lab Results  Component Value Date   CREATININE 0.81 08/23/2013   BUN 16 08/23/2013   NA 134 (L) 08/23/2013   K 3.7 08/23/2013   CL 99 08/23/2013   CO2 22 08/23/2013   Lab Results  Component Value Date   HGBA1C 5.1 05/21/2013   Lipid Panel  No results found for: CHOL, TRIG, HDL, CHOLHDL, VLDL, LDLCALC      Assessment and plan:   1. Needs flu shot  - Flu Vaccine QUAD 36+ mos PF IM (Fluarix & Fluzone Quad PF)  2. Moderate episode of recurrent major depressive disorder (HCC)  - busPIRone (BUSPAR) 15 MG tablet; Take 1 tablet (15 mg total) by mouth 2 (two) times daily.  Dispense: 60 tablet; Refill: 3 - TSH  3. Generalized anxiety disorder  - busPIRone (BUSPAR) 15 MG tablet; Take 1 tablet (15 mg total) by mouth 2 (two) times daily.  Dispense: 60 tablet; Refill: 3 - hydrOXYzine (ATARAX/VISTARIL) 25 MG tablet; Take 1 tablet (25 mg total) by mouth 3 (three) times daily as needed.  Dispense: 90 tablet; Refill: 3  4. Chronic midline low back pain without sciatica  - No major pain at this time  5. Essential hypertension Restart - nebivolol (BYSTOLIC) 5 MG tablet; Take 1 tablet (5 mg  total) by mouth daily.  Dispense: 90 tablet; Refill: 3  - CBC with Differential/Platelet - COMPLETE METABOLIC PANEL WITH GFR - Lipid panel - Urinalysis, Complete  Return in about 3 months (around 02/02/2017) for Generalized Anxiety Disorder, Major Depression, Follow up HTN.  The patient was given clear instructions to go to ER or return to medical center if symptoms don't improve, worsen or new problems develop. The patient verbalized understanding. The patient was told to call to get lab results if they haven't heard anything in the next week.    This note has been created with Education officer, environmentalDragon speech recognition software and smart phrase technology. Any transcriptional errors are unintentional.   Jeanann LewandowskyJEGEDE, Slayden Mennenga, MD, MHA, Maxwell CaulFACP, FAAP, CPE [facility] [place], [place] [phone]   11/04/2016, 12:19 PM

## 2016-11-05 LAB — URINALYSIS, COMPLETE
Bacteria, UA: NONE SEEN [HPF]
Bilirubin Urine: NEGATIVE
Casts: NONE SEEN [LPF]
Crystals: NONE SEEN [HPF]
GLUCOSE, UA: NEGATIVE
HGB URINE DIPSTICK: NEGATIVE
Ketones, ur: NEGATIVE
NITRITE: NEGATIVE
Protein, ur: NEGATIVE
SQUAMOUS EPITHELIAL / LPF: NONE SEEN [HPF] (ref <=5)
Specific Gravity, Urine: 1.025 (ref 1.001–1.035)
Yeast: NONE SEEN [HPF]
pH: 5.5 (ref 5.0–8.0)

## 2016-11-09 ENCOUNTER — Ambulatory Visit: Payer: Self-pay

## 2016-11-10 ENCOUNTER — Encounter: Payer: Self-pay | Admitting: Internal Medicine

## 2016-11-19 ENCOUNTER — Other Ambulatory Visit: Payer: Self-pay | Admitting: *Deleted

## 2016-11-19 DIAGNOSIS — I10 Essential (primary) hypertension: Secondary | ICD-10-CM

## 2016-11-19 MED ORDER — NEBIVOLOL HCL 5 MG PO TABS: 5.0000 mg | ORAL_TABLET | Freq: Every day | ORAL | 3 refills | Status: DC

## 2016-11-19 NOTE — Telephone Encounter (Signed)
PRINTED FOR PASS PROGRAM 

## 2016-11-25 ENCOUNTER — Emergency Department (HOSPITAL_COMMUNITY): Payer: Self-pay

## 2016-11-25 ENCOUNTER — Encounter (HOSPITAL_COMMUNITY): Payer: Self-pay | Admitting: *Deleted

## 2016-11-25 DIAGNOSIS — Z79899 Other long term (current) drug therapy: Secondary | ICD-10-CM | POA: Insufficient documentation

## 2016-11-25 DIAGNOSIS — F17210 Nicotine dependence, cigarettes, uncomplicated: Secondary | ICD-10-CM | POA: Insufficient documentation

## 2016-11-25 DIAGNOSIS — I10 Essential (primary) hypertension: Secondary | ICD-10-CM | POA: Insufficient documentation

## 2016-11-25 DIAGNOSIS — R002 Palpitations: Secondary | ICD-10-CM | POA: Insufficient documentation

## 2016-11-25 LAB — CBC WITH DIFFERENTIAL/PLATELET
BASOS ABS: 0 10*3/uL (ref 0.0–0.1)
BASOS PCT: 0 %
EOS ABS: 0.2 10*3/uL (ref 0.0–0.7)
EOS PCT: 2 %
HCT: 46.3 % (ref 39.0–52.0)
HEMOGLOBIN: 16.4 g/dL (ref 13.0–17.0)
Lymphocytes Relative: 49 %
Lymphs Abs: 5.7 10*3/uL — ABNORMAL HIGH (ref 0.7–4.0)
MCH: 31 pg (ref 26.0–34.0)
MCHC: 35.4 g/dL (ref 30.0–36.0)
MCV: 87.5 fL (ref 78.0–100.0)
Monocytes Absolute: 0.7 10*3/uL (ref 0.1–1.0)
Monocytes Relative: 6 %
NEUTROS PCT: 43 %
Neutro Abs: 5.1 10*3/uL (ref 1.7–7.7)
PLATELETS: 281 10*3/uL (ref 150–400)
RBC: 5.29 MIL/uL (ref 4.22–5.81)
RDW: 12.9 % (ref 11.5–15.5)
WBC: 11.8 10*3/uL — AB (ref 4.0–10.5)

## 2016-11-25 LAB — I-STAT TROPONIN, ED: Troponin i, poc: 0 ng/mL (ref 0.00–0.08)

## 2016-11-25 NOTE — ED Triage Notes (Signed)
Patient states he has felt like his heart has been racing for about 1 year.  Tonight he put his hand over his heart and I felt like it was racing.  In triage his heart was a NSR at 78-80  NAD noted

## 2016-11-26 ENCOUNTER — Emergency Department (HOSPITAL_COMMUNITY)
Admission: EM | Admit: 2016-11-26 | Discharge: 2016-11-26 | Disposition: A | Discharge status: Home / Self Care | Payer: Self-pay | Attending: Emergency Medicine | Admitting: Emergency Medicine

## 2016-11-26 DIAGNOSIS — R002 Palpitations: Secondary | ICD-10-CM

## 2016-11-26 LAB — COMPREHENSIVE METABOLIC PANEL
ALBUMIN: 4.1 g/dL (ref 3.5–5.0)
ALK PHOS: 75 U/L (ref 38–126)
ALT: 43 U/L (ref 17–63)
AST: 35 U/L (ref 15–41)
Anion gap: 8 (ref 5–15)
BUN: 18 mg/dL (ref 6–20)
CHLORIDE: 103 mmol/L (ref 101–111)
CO2: 25 mmol/L (ref 22–32)
CREATININE: 0.87 mg/dL (ref 0.61–1.24)
Calcium: 9.1 mg/dL (ref 8.9–10.3)
GFR calc Af Amer: >60 mL/min (ref >60)
GFR calc non Af Amer: >60 mL/min (ref >60)
GLUCOSE: 105 mg/dL — AB (ref 65–99)
Potassium: 4.3 mmol/L (ref 3.5–5.1)
SODIUM: 136 mmol/L (ref 135–145)
Total Bilirubin: 0.6 mg/dL (ref 0.3–1.2)
Total Protein: 7.2 g/dL (ref 6.5–8.1)

## 2016-11-26 MED ORDER — METOPROLOL TARTRATE 25 MG PO TABS: 12.5000 mg | ORAL_TABLET | Freq: Two times a day (BID) | ORAL | 2 refills | Status: DC | PRN

## 2016-11-26 MED ORDER — CYCLOBENZAPRINE HCL 10 MG PO TABS: 10.0000 mg | ORAL_TABLET | Freq: Three times a day (TID) | ORAL | 0 refills | Status: AC | PRN

## 2016-11-26 NOTE — ED Notes (Signed)
Pt requesting a muscle relaxer for back pain prior to discharge

## 2016-11-26 NOTE — ED Provider Notes (Signed)
[facility]-EMERGENCY DEPT Provider Note   CSN: [id number] Arrival date & time: 11/25/16  2225   By signing my name below, I, Clovis PuAvnee Patel, attest that this documentation has been prepared under the direction and in the presence of Gilda Creasehristopher J Jairen Goldfarb, MD  Electronically Signed: Clovis PuAvnee Patel, ED Scribe. 11/26/16. 3:41 AM.   History   Chief Complaint Chief Complaint  Patient presents with  . Heart racing   The history is provided by the patient. No language interpreter was used.   HPI Comments:  Vincent Griffith is a 35 y.o. male who presents to the Emergency Department complaining of intermittent episodes of palpitations which last for 1 hour x 1 year. Pt states these episodes occur about 3 times a week. He also reports back pain and bilateral leg pain with these episodes and notes caffeine use. No alleviating factors noted. Pt denies any other associated symptoms and modifying factors at this time.     Past Medical History:  Diagnosis Date  . Anxiety associated with depression   . Heart palpitations   . Hematuria, microscopic   . Hypertension, essential, benign   . Microscopic hematuria    2010  . Premature ventricular contraction   . Premature ventricular contraction     Patient Active Problem List   Diagnosis Date Noted  . Generalized anxiety disorder 11/04/2016  . Moderate episode of recurrent major depressive disorder (HCC) 11/04/2016  . Needs flu shot 11/04/2016  . Palpitations 12/27/2013  . GERD (gastroesophageal reflux disease) 08/21/2013  . HEMORRHOIDS 03/02/2010  . PREMATURE VENTRICULAR CONTRACTIONS, FREQUENT 11/06/2009  . MICROSCOPIC HEMATURIA 09/10/2009  . ANXIETY DEPRESSION 09/09/2009  . ESSENTIAL HYPERTENSION, BENIGN 09/09/2009    Past Surgical History:  Procedure Laterality Date  . COLONOSCOPY N/A 08/21/2013   Procedure: COLONOSCOPY;  Surgeon: Shirley FriarVincent C. Schooler, MD;  Location: [facility];  Service: Endoscopy;  Laterality: N/A;  .  ESOPHAGOGASTRODUODENOSCOPY N/A 08/21/2013   Procedure: ESOPHAGOGASTRODUODENOSCOPY (EGD);  Surgeon: Shirley FriarVincent C. Schooler, MD;  [facility];  Service: Endoscopy;  Laterality: N/A;       Home Medications    Prior to Admission medications   Medication Sig Start Date End Date Taking? Authorizing Provider  busPIRone (BUSPAR) 15 MG tablet Take 1 tablet (15 mg total) by mouth 2 (two) times daily. 11/04/16   Quentin Angstlugbemiga E Jegede, MD  diphenhydrAMINE (BENADRYL) 25 mg capsule Take 1 capsule (25 mg total) by mouth every 6 (six) hours as needed for itching. 09/21/13   Sunnie NielsenBrian Opitz, MD  EPINEPHrine (EPIPEN) 0.3 mg/0.3 mL SOAJ injection Inject 0.3 mLs (0.3 mg total) into the muscle as needed. 09/21/13   Sunnie NielsenBrian Opitz, MD  hydrOXYzine (ATARAX/VISTARIL) 25 MG tablet Take 1 tablet (25 mg total) by mouth 3 (three) times daily as needed. 11/04/16   Quentin Angstlugbemiga E Jegede, MD  metoprolol (LOPRESSOR) 25 MG tablet Take 0.5 tablets (12.5 mg total) by mouth 2 (two) times daily as needed (palpitations or racing heart beat). 11/26/16   Gilda Creasehristopher J Gust Eugene, MD  nebivolol (BYSTOLIC) 5 MG tablet Take 1 tablet (5 mg total) by mouth daily. 11/19/16   Quentin Angstlugbemiga E Jegede, MD    Family History Family History  Problem Relation Age of Onset  . Diabetes Other   . Hypertension Other     Social History Social History  Substance Use Topics  . Smoking status: Current Every Day Smoker    Packs/day: 0.50    Years: 12.00    Types: Cigarettes  . Smokeless tobacco: Current User  . Alcohol use No  Allergies   Other   Review of Systems Review of Systems  Constitutional: Negative for fever.  Cardiovascular: Positive for palpitations.  Musculoskeletal: Positive for back pain and myalgias.  All other systems reviewed and are negative.  Physical Exam Updated Vital Signs BP 123/67 (BP Location: Right Arm)   Pulse 69   Temp 98.3 F (36.8 C) (Oral)   Resp 18   SpO2 99%   Physical Exam  Constitutional: He is  oriented to person, place, and time. He appears well-developed and well-nourished. No distress.  HENT:  Head: Normocephalic and atraumatic.  Right Ear: Hearing normal.  Left Ear: Hearing normal.  Nose: Nose normal.  Mouth/Throat: Oropharynx is clear and moist and mucous membranes are normal.  Eyes: Conjunctivae and EOM are normal. Pupils are equal, round, and reactive to light.  Neck: Normal range of motion. Neck supple.  Cardiovascular: Regular rhythm, S1 normal and S2 normal.  Exam reveals no gallop and no friction rub.   No murmur heard. Pulmonary/Chest: Effort normal and breath sounds normal. No respiratory distress. He exhibits no tenderness.  Abdominal: Soft. Normal appearance and bowel sounds are normal. There is no hepatosplenomegaly. There is no tenderness. There is no rebound, no guarding, no tenderness at McBurney's point and negative Murphy's sign. No hernia.  Musculoskeletal: Normal range of motion.  Neurological: He is alert and oriented to person, place, and time. He has normal strength. No cranial nerve deficit or sensory deficit. Coordination normal. GCS eye subscore is 4. GCS verbal subscore is 5. GCS motor subscore is 6.  Skin: Skin is warm, dry and intact. No rash noted. No cyanosis.  Psychiatric: He has a normal mood and affect. His speech is normal and behavior is normal. Thought content normal.  Nursing note and vitals reviewed.  ED Treatments / Results  DIAGNOSTIC STUDIES:  Oxygen Saturation is 99% on RA, normal by my interpretation.    COORDINATION OF CARE:  3:41 AM Discussed treatment plan with pt at bedside and pt agreed to plan.  Labs (all labs ordered are listed, but only abnormal results are displayed) Labs Reviewed  CBC WITH DIFFERENTIAL/PLATELET - Abnormal; Notable for the following:       Result Value   WBC 11.8 (*)    Lymphs Abs 5.7 (*)    All other components within normal limits  COMPREHENSIVE METABOLIC PANEL - Abnormal; Notable for the  following:    Glucose, Bld 105 (*)    All other components within normal limits  I-STAT TROPOININ, ED    EKG  EKG Interpretation None       Radiology Dg Chest 2 View  Result Date: 11/25/2016 CLINICAL DATA:  Palpitations. EXAM: CHEST  2 VIEW COMPARISON:  08/23/2013 FINDINGS: The cardiomediastinal contours are normal. The lungs are clear. Pulmonary vasculature is normal. No consolidation, pleural effusion, or pneumothorax. No acute osseous abnormalities are seen. Monitoring lead projects over the right upper chest. IMPRESSION: Clear lungs.  No acute abnormality. Electronically Signed   By: Rubye OaksMelanie  Ehinger M.D.   On: 11/25/2016 23:22    Procedures Procedures (including critical care time)  Medications Ordered in ED Medications - No data to display   Initial Impression / Assessment and Plan / ED Course  I have reviewed the triage vital signs and the nursing notes.  Pertinent labs & imaging results that were available during my care of the patient were reviewed by me and considered in my medical decision making (see chart for details).  Clinical Course    Patient  presents with complaints of palpitations. Patient reports that this occurs approximately 3 times per week and has been ongoing for one year. He has been able to take his pulse during these episodes, never has tachycardia. This is suspicious for PACs or PVCs, however, no abnormality noted on EKG or monitor here in the ER. He is not currently symptomatic. Will initiate low-dose Lopressor, have follow-up with cardiology for outpatient monitoring.  Final Clinical Impressions(s) / ED Diagnoses   Final diagnoses:  Heart palpitations    New Prescriptions New Prescriptions   METOPROLOL (LOPRESSOR) 25 MG TABLET    Take 0.5 tablets (12.5 mg total) by mouth 2 (two) times daily as needed (palpitations or racing heart beat).  I personally performed the services described in this documentation, which was scribed in my presence.  The recorded information has been reviewed and is accurate.     Gilda Crease, MD 11/26/16 [phone]

## 2016-11-26 NOTE — ED Notes (Signed)
No answer when called to be taken to a room. 

## 2016-12-02 ENCOUNTER — Ambulatory Visit: Payer: Self-pay | Attending: Internal Medicine

## 2016-12-02 MED FILL — $BYSTOLIC 5 MG TABLET: 5 | 30 days supply | Qty: 30 | Fill #1

## 2016-12-19 ENCOUNTER — Encounter: Payer: Self-pay | Admitting: Internal Medicine

## 2016-12-20 NOTE — Telephone Encounter (Signed)
Patient admits to not being able to control his eating habits to aid in lowering cholesterol level. Patient is requesting a cholesterol lowering medication at this time.

## 2016-12-27 ENCOUNTER — Encounter: Payer: Self-pay | Admitting: Internal Medicine

## 2016-12-27 NOTE — Telephone Encounter (Signed)
Patient is requesting cholesterol lowering medication at this time. Patient has not been able to control his eating habits.

## 2016-12-30 MED FILL — $BYSTOLIC 5 MG TABLET: 5 | 30 days supply | Qty: 30 | Fill #2

## 2017-01-12 ENCOUNTER — Ambulatory Visit: Payer: Self-pay | Attending: Internal Medicine | Admitting: Internal Medicine

## 2017-01-12 ENCOUNTER — Encounter: Payer: Self-pay | Admitting: Internal Medicine

## 2017-01-12 VITALS — BP 112/74 | HR 77 | Temp 98.4°F | Resp 18 | Ht 68.0 in | Wt 213.4 lb

## 2017-01-12 DIAGNOSIS — I10 Essential (primary) hypertension: Secondary | ICD-10-CM

## 2017-01-12 DIAGNOSIS — E781 Pure hyperglyceridemia: Secondary | ICD-10-CM

## 2017-01-12 DIAGNOSIS — Z91018 Allergy to other foods: Secondary | ICD-10-CM | POA: Insufficient documentation

## 2017-01-12 DIAGNOSIS — F411 Generalized anxiety disorder: Secondary | ICD-10-CM

## 2017-01-12 MED ORDER — FENOFIBRATE 145 MG PO TABS: 145.0000 mg | ORAL_TABLET | Freq: Every day | ORAL | 3 refills | Status: AC

## 2017-01-12 MED ORDER — NEBIVOLOL HCL 5 MG PO TABS: 5.0000 mg | ORAL_TABLET | Freq: Every day | ORAL | 1 refills | Status: DC

## 2017-01-12 MED FILL — FENOFIBRATE 145 MG TABLET: 145 | 30 days supply | Qty: 30 | Fill #0

## 2017-01-12 NOTE — Progress Notes (Signed)
Patient is here for FU  Patient denies pain at this time.  Patient has taken medication today. Patient has eaten today.   

## 2017-01-12 NOTE — Progress Notes (Signed)
Ruben Mahler, is a 36 y.o. male  ZOX:[id number]  WJX:[id number]  DOB - 1981/03/07  Chief Complaint  Patient presents with  . Hyperlipidemia      Subjective:   Toddy Boyd is a 35 y.o. male with history of hypertension, anxiety and depression here today for a follow up visit and medication refill. Patient is travelling to his home country to get married, will be gone for over 6 months, need his medication refilled for that long because he may not get the same meds on the local formulary there. He also wants to start medication for his high triglyceride level since diet and exercise seem not to be working. He has no complaint today. His anxiety level is much better. He denies any suicidal thought or ideation. Patient has No headache, No chest pain, No abdominal pain - No Nausea, No new weakness tingling or numbness, No Cough - SOB.  No problems updated.  ALLERGIES: Allergies  Allergen Reactions  . Other Shortness Of Breath    pringles and strawberries     PAST MEDICAL HISTORY: Past Medical History:  Diagnosis Date  . Anxiety associated with depression   . Heart palpitations   . Hematuria, microscopic   . Hypertension, essential, benign   . Microscopic hematuria    2010  . Premature ventricular contraction   . Premature ventricular contraction     MEDICATIONS AT HOME: Prior to Admission medications   Medication Sig Start Date End Date Taking? Authorizing Provider  busPIRone (BUSPAR) 15 MG tablet Take 1 tablet (15 mg total) by mouth 2 (two) times daily. 11/04/16  Yes Quentin Angst, MD  cyclobenzaprine (FLEXERIL) 10 MG tablet Take 1 tablet (10 mg total) by mouth 3 (three) times daily as needed for muscle spasms. 11/26/16  Yes Gilda Crease, MD  diphenhydrAMINE (BENADRYL) 25 mg capsule Take 1 capsule (25 mg total) by mouth every 6 (six) hours as needed for itching. 09/21/13  Yes Sunnie Nielsen, MD  EPINEPHrine (EPIPEN) 0.3 mg/0.3 mL SOAJ injection Inject 0.3 mLs (0.3  mg total) into the muscle as needed. 09/21/13  Yes Sunnie Nielsen, MD  hydrOXYzine (ATARAX/VISTARIL) 25 MG tablet Take 1 tablet (25 mg total) by mouth 3 (three) times daily as needed. 11/04/16  Yes Olugbemiga Annitta Needs, MD  nebivolol (BYSTOLIC) 5 MG tablet Take 1 tablet (5 mg total) by mouth daily. 01/12/17  Yes Quentin Angst, MD  fenofibrate (TRICOR) 145 MG tablet Take 1 tablet (145 mg total) by mouth daily. 01/12/17   Quentin Angst, MD    Objective:   Vitals:   01/12/17 1148  BP: 112/74  Pulse: 77  Resp: 18  Temp: 98.4 F (36.9 C)  TempSrc: Oral  SpO2: 100%  Weight: 213 lb 6.4 oz (96.8 kg)  Height: 5\' 8"  (1.727 m)   Exam General appearance : Awake, alert, not in any distress. Speech Clear. Not toxic looking HEENT: Atraumatic and Normocephalic, pupils equally reactive to light and accomodation Neck: Supple, no JVD. No cervical lymphadenopathy.  Chest: Good air entry bilaterally, no added sounds  CVS: S1 S2 regular, no murmurs.  Abdomen: Bowel sounds present, Non tender and not distended with no gaurding, rigidity or rebound. Extremities: B/L Lower Ext shows no edema, both legs are warm to touch Neurology: Awake alert, and oriented X 3, CN II-XII intact, Non focal Skin: No Rash  Data Review Lab Results  Component Value Date   HGBA1C 5.1 05/21/2013    Assessment & Plan   1. Generalized  anxiety disorder Stable. Continue behavioral therapy  2. Essential hypertension Refill - nebivolol (BYSTOLIC) 5 MG tablet; Take 1 tablet (5 mg total) by mouth daily.  Dispense: 180 tablet; Refill: 1  3. Hypertriglyceridemia Refill - fenofibrate (TRICOR) 145 MG tablet; Take 1 tablet (145 mg total) by mouth daily.  Dispense: 90 tablet; Refill: 3  Patient have been counseled extensively about nutrition and exercise. Other issues discussed during this visit include: low cholesterol diet, weight control and daily exercise, importance of adherence with medications and regular follow-up.  We also discussed long term complications of uncontrolled hypertension.   Return in about 6 months (around 07/12/2017) for Routine Follow Up.  The patient was given clear instructions to go to ER or return to medical center if symptoms don't improve, worsen or new problems develop. The patient verbalized understanding. The patient was told to call to get lab results if they haven't heard anything in the next week.   This note has been created with Education officer, environmentalDragon speech recognition software and smart phrase technology. Any transcriptional errors are unintentional.    Jeanann LewandowskyJEGEDE, OLUGBEMIGA, MD, MHA, Maxwell CaulFACP, FAAP, CPE [facility] [place], [place] [phone]   01/13/2017, 12:37 PM      HPI Review of Systems Objective:  Physical Exam

## 2017-01-12 NOTE — Patient Instructions (Signed)
Sinus Tachycardia Sinus tachycardia is a kind of fast heartbeat. In sinus tachycardia, the heart beats more than 100 times a minute. Sinus tachycardia starts in a part of the heart called the sinus node. Sinus tachycardia may be harmless, or it may be a sign of a serious condition. What are the causes? This condition may be caused by:  Exercise or exertion.  A fever.  Pain.  Loss of body fluids (dehydration).  Severe bleeding (hemorrhage).  Anxiety and stress.  Certain substances, including:  Alcohol.  Caffeine.  Tobacco and nicotine products.  Diet pills.  Illegal drugs.  Medical conditions including:  Heart disease.  An infection.  An overactive thyroid (hyperthyroidism).  A lack of red blood cells (anemia). What are the signs or symptoms? Symptoms of this condition include:  A feeling that the heart is beating quickly (palpitations).  Suddenly noticing your heartbeat (cardiac awareness).  Dizziness.  Tiredness (fatigue).  Shortness of breath.  Chest pain.  Nausea.  Fainting. How is this diagnosed? This condition is diagnosed with:  A physical exam.  Other tests, such as:  Blood tests.  An electrocardiogram (ECG). This test measures the electrical activity of the heart.  Holter monitoring. For this test, you wear a device that records your heartbeat for one or more days. You may be referred to a heart specialist (cardiologist). How is this treated? Treatment for this condition depends on the cause or underlying condition. Treatment may involve:  Treating the underlying condition.  Taking new medicines or changing your current medicines as told by your health care provider.  Making changes to your diet or lifestyle.  Practicing relaxation methods. Follow these instructions at home: Lifestyle   Do not use any products that contain nicotine or tobacco, such as cigarettes and e-cigarettes. If you need help quitting, ask your health care  provider.  Learn relaxation methods, like deep breathing, to help you when you get stressed or anxious.  Do not use illegal drugs, such as cocaine.  Do not abuse alcohol. Limit alcohol intake to no more than 1 drink a day for non-pregnant women and 2 drinks a day for men. One drink equals 12 oz of beer, 5 oz of wine, or 1 oz of hard liquor.  Find time to rest and relax often. This reduces stress.  Avoid:  Caffeine.  Stimulants such as over-the-counter diet pills or pills that help you to stay awake.  Situations that cause anxiety or stress. General instructions   Drink enough fluids to keep your urine clear or pale yellow.  Take over-the-counter and prescription medicines only as told by your health care provider.  Keep all follow-up visits as told by your health care provider. This is important. Contact a health care provider if:  You have a fever.  You have vomiting or diarrhea that keeps happening (is persistent). Get help right away if:  You have pain in your chest, upper arms, jaw, or neck.  You become weak or dizzy.  You feel faint.  You have palpitations that do not go away. This information is not intended to replace advice given to you by your health care provider. Make sure you discuss any questions you have with your health care provider. Document Released: 12/23/2004 Document Revised: 06/12/2016 Document Reviewed: 05/30/2015 Elsevier Interactive Patient Education  2017 [employer]  

## 2017-01-25 MED FILL — $BYSTOLIC 5 MG TABLET: 5 | 30 days supply | Qty: 30 | Fill #3

## 2017-01-30 ENCOUNTER — Emergency Department (HOSPITAL_COMMUNITY): Payer: Self-pay

## 2017-01-30 ENCOUNTER — Encounter (HOSPITAL_COMMUNITY): Payer: Self-pay | Admitting: Emergency Medicine

## 2017-01-30 ENCOUNTER — Emergency Department (HOSPITAL_COMMUNITY)
Admission: EM | Admit: 2017-01-30 | Discharge: 2017-01-30 | Disposition: A | Discharge status: Home / Self Care | Payer: Self-pay | Attending: Emergency Medicine | Admitting: Emergency Medicine

## 2017-01-30 DIAGNOSIS — I10 Essential (primary) hypertension: Secondary | ICD-10-CM | POA: Insufficient documentation

## 2017-01-30 DIAGNOSIS — Z79899 Other long term (current) drug therapy: Secondary | ICD-10-CM | POA: Insufficient documentation

## 2017-01-30 DIAGNOSIS — F17210 Nicotine dependence, cigarettes, uncomplicated: Secondary | ICD-10-CM | POA: Insufficient documentation

## 2017-01-30 DIAGNOSIS — R002 Palpitations: Secondary | ICD-10-CM | POA: Insufficient documentation

## 2017-01-30 DIAGNOSIS — R5383 Other fatigue: Secondary | ICD-10-CM | POA: Insufficient documentation

## 2017-01-30 DIAGNOSIS — R5381 Other malaise: Secondary | ICD-10-CM

## 2017-01-30 LAB — CBC
HCT: 45 % (ref 39.0–52.0)
HEMOGLOBIN: 15.7 g/dL (ref 13.0–17.0)
MCH: 30.4 pg (ref 26.0–34.0)
MCHC: 34.9 g/dL (ref 30.0–36.0)
MCV: 87.2 fL (ref 78.0–100.0)
Platelets: 260 10*3/uL (ref 150–400)
RBC: 5.16 MIL/uL (ref 4.22–5.81)
RDW: 12.6 % (ref 11.5–15.5)
WBC: 9.4 10*3/uL (ref 4.0–10.5)

## 2017-01-30 LAB — HEPATIC FUNCTION PANEL
ALT: 40 U/L (ref 17–63)
AST: 35 U/L (ref 15–41)
Albumin: 4.1 g/dL (ref 3.5–5.0)
Alkaline Phosphatase: 66 U/L (ref 38–126)
BILIRUBIN DIRECT: 0.3 mg/dL (ref 0.1–0.5)
BILIRUBIN INDIRECT: 0.7 mg/dL (ref 0.3–0.9)
Total Bilirubin: 1 mg/dL (ref 0.3–1.2)
Total Protein: 6.7 g/dL (ref 6.5–8.1)

## 2017-01-30 LAB — BASIC METABOLIC PANEL
ANION GAP: 8 (ref 5–15)
BUN: 13 mg/dL (ref 6–20)
CALCIUM: 9.7 mg/dL (ref 8.9–10.3)
CO2: 29 mmol/L (ref 22–32)
Chloride: 101 mmol/L (ref 101–111)
Creatinine, Ser: 0.96 mg/dL (ref 0.61–1.24)
GLUCOSE: 105 mg/dL — AB (ref 65–99)
Potassium: 4 mmol/L (ref 3.5–5.1)
SODIUM: 138 mmol/L (ref 135–145)

## 2017-01-30 LAB — I-STAT TROPONIN, ED: TROPONIN I, POC: 0 ng/mL (ref 0.00–0.08)

## 2017-01-30 LAB — DIFFERENTIAL
BASOS ABS: 0 10*3/uL (ref 0.0–0.1)
Basophils Relative: 0 %
Eosinophils Absolute: 0.2 10*3/uL (ref 0.0–0.7)
Eosinophils Relative: 2 %
Lymphocytes Relative: 49 %
Lymphs Abs: 4.4 10*3/uL — ABNORMAL HIGH (ref 0.7–4.0)
MONOS PCT: 7 %
Monocytes Absolute: 0.6 10*3/uL (ref 0.1–1.0)
NEUTROS ABS: 3.9 10*3/uL (ref 1.7–7.7)
Neutrophils Relative %: 42 %

## 2017-01-30 NOTE — Discharge Instructions (Signed)
Drink plenty of fluids. Return if symptoms worsen.

## 2017-01-30 NOTE — ED Provider Notes (Signed)
[facility]PT Provider Note   CSN: [id number] Arrival date & time: 01/30/17  2228  By signing my name below, I, Vincent Griffith, attest that this documentation has been prepared under the direction and in the presence of [name], MD. Electronically Signed: Elder Griffith, Scribe. 01/30/17. 11:09 PM.   History   Chief Complaint Chief Complaint  Patient presents with  . Shortness of Breath  . Dizziness  . Near Syncope    HPI Vincent Griffith is a 36 y.o. male with history of HTN who presents to the ED for evaluation of palpitations. This patient states that 3 days ago he began experiencing intermittent episodes where "he hears his heart clicking". These instances last "for hours" without any associated chest pain. Occasionally experiences dyspnea. He is also reporting fatigue and lightheadedness over this time period. Lightheadedness is unassociated with changing position or movement. Denies any nausea, vomiting, diarrhea, or fevers. No syncope.   The history is provided by the patient. No language interpreter was used.    Past Medical History:  Diagnosis Date  . Anxiety associated with depression   . Heart palpitations   . Hematuria, microscopic   . Hypertension, essential, benign   . Microscopic hematuria    2010  . Premature ventricular contraction   . Premature ventricular contraction     Patient Active Problem List   Diagnosis Date Noted  . Generalized anxiety disorder 11/04/2016  . Moderate episode of recurrent major depressive disorder (HCC) 11/04/2016  . Needs flu shot 11/04/2016  . Palpitations 12/27/2013  . GERD (gastroesophageal reflux disease) 08/21/2013  . HEMORRHOIDS 03/02/2010  . PREMATURE VENTRICULAR CONTRACTIONS, FREQUENT 11/06/2009  . MICROSCOPIC HEMATURIA 09/10/2009  . ANXIETY DEPRESSION 09/09/2009  . Essential hypertension 09/09/2009    Past Surgical History:  Procedure Laterality Date  . COLONOSCOPY N/A 08/21/2013   Procedure: COLONOSCOPY;   Surgeon: Shirley Friar, MD;  Location: [facility];  Service: Endoscopy;  Laterality: N/A;  . ESOPHAGOGASTRODUODENOSCOPY N/A 08/21/2013   Procedure: ESOPHAGOGASTRODUODENOSCOPY (EGD);  Surgeon: Shirley Friar, MD;  Location: [facility];  Service: Endoscopy;  Laterality: N/A;       Home Medications    Prior to Admission medications   Medication Sig Start Date End Date Taking? Authorizing Provider  cyclobenzaprine (FLEXERIL) 10 MG tablet Take 1 tablet (10 mg total) by mouth 3 (three) times daily as needed for muscle spasms. 11/26/16  Yes Gilda Crease, MD  diphenhydrAMINE (BENADRYL) 25 mg capsule Take 1 capsule (25 mg total) by mouth every 6 (six) hours as needed for itching. 09/21/13  Yes Sunnie Nielsen, MD  EPINEPHrine (EPIPEN) 0.3 mg/0.3 mL SOAJ injection Inject 0.3 mLs (0.3 mg total) into the muscle as needed. Patient taking differently: Inject 0.3 mg into the muscle daily as needed (allergic reaction).  09/21/13  Yes Sunnie Nielsen, MD  hydrOXYzine (ATARAX/VISTARIL) 25 MG tablet Take 1 tablet (25 mg total) by mouth 3 (three) times daily as needed. Patient taking differently: Take 25 mg by mouth 3 (three) times daily as needed for anxiety.  11/04/16  Yes Olugbemiga Annitta Needs, MD  nebivolol (BYSTOLIC) 5 MG tablet Take 1 tablet (5 mg total) by mouth daily. 01/12/17  Yes Quentin Angst, MD  fenofibrate (TRICOR) 145 MG tablet Take 1 tablet (145 mg total) by mouth daily. Patient not taking: Reported on 01/30/2017 01/12/17   Quentin Angst, MD    Family History Family History  Problem Relation Age of Onset  . Diabetes Other   . Hypertension Other  Social History Social History  Substance Use Topics  . Smoking status: Current Every Day Smoker    Packs/day: 0.50    Years: 12.00    Types: Cigarettes  . Smokeless tobacco: Current User  . Alcohol use No     Allergies   Other   Review of Systems Review of Systems  Constitutional: Positive for fatigue.  Negative for fever.  Respiratory: Positive for shortness of breath.   Cardiovascular: Negative for chest pain.       "heart clicking"  Gastrointestinal: Negative for abdominal pain, diarrhea, nausea and vomiting.  Neurological: Positive for light-headedness.  All other systems reviewed and are negative.    Physical Exam Updated Vital Signs BP 149/80 (BP Location: Left Arm)   Pulse 72   Temp 99.1 F (37.3 C) (Oral)   Resp 16   Ht 5\' 7"  (1.702 m)   Wt 195 lb (88.5 kg)   SpO2 98%   BMI 30.54 kg/m   Physical Exam  Constitutional: He is oriented to person, place, and time. He appears well-developed and well-nourished.  HENT:  Head: Normocephalic and atraumatic.  Eyes: EOM are normal. Pupils are equal, round, and reactive to light.  Neck: Normal range of motion. Neck supple. No JVD present.  Cardiovascular: Normal rate, regular rhythm and normal heart sounds.   No murmur heard. Pulmonary/Chest: Effort normal and breath sounds normal. He has no wheezes. He has no rales. He exhibits no tenderness.  Abdominal: Soft. Bowel sounds are normal. He exhibits no distension and no mass. There is no tenderness.  Musculoskeletal: Normal range of motion. He exhibits no edema.  Lymphadenopathy:    He has no cervical adenopathy.  Neurological: He is alert and oriented to person, place, and time. No cranial nerve deficit. He exhibits normal muscle tone. Coordination normal.  Skin: Skin is warm and dry. No rash noted.  Psychiatric: He has a normal mood and affect. His behavior is normal. Judgment and thought content normal.  Nursing note and vitals reviewed.    ED Treatments / Results  DIAGNOSTIC STUDIES: Oxygen Saturation is 98 percent on room air which is normal by my interpretation.    COORDINATION OF CARE: 11:05 PM Discussed treatment plan with pt at bedside and pt agreed to plan.  Labs (all labs ordered are listed, but only abnormal results are displayed) Labs Reviewed  BASIC  METABOLIC PANEL - Abnormal; Notable for the following:       Result Value   Glucose, Bld 105 (*)    All other components within normal limits  DIFFERENTIAL - Abnormal; Notable for the following:    Lymphs Abs 4.4 (*)    All other components within normal limits  CBC  HEPATIC FUNCTION PANEL  I-STAT TROPOININ, ED    EKG  EKG Interpretation  Date/Time:  Sunday January 30 2017 22:35:18 EST Ventricular Rate:  77 PR Interval:  146 QRS Duration: 96 QT Interval:  346 QTC Calculation: 391 R Axis:   -45 Text Interpretation:  Normal sinus rhythm Left axis deviation Incomplete right bundle branch block Abnormal ECG When compared with ECG of 11/25/2016, No significant change was found Confirmed by [facility]  MD, Erricka Falkner ([id number]) on 01/30/2017 10:59:31 PM       Radiology Dg Chest 2 View  Result Date: 01/30/2017 CLINICAL DATA:  Dyspnea tonight. EXAM: CHEST  2 VIEW COMPARISON:  11/25/2016 FINDINGS: The heart size and mediastinal contours are within normal limits. Both lungs are clear. The visualized skeletal structures are unremarkable. IMPRESSION: No active  cardiopulmonary disease. Electronically Signed   By: Ellery Plunk M.D.   On: 01/30/2017 23:21    Procedures Procedures (including critical care time)  Medications Ordered in ED Medications - No data to display   Initial Impression / Assessment and Plan / ED Course  I have reviewed the triage vital signs and the nursing notes.  Pertinent labs & imaging results that were available during my care of the patient were reviewed by me and considered in my medical decision making (see chart for details).  Subjective palpitations and since that he can hear his heart clicking associated with sense of general weakness and frequent bowel movements. Overall picture seems most consistent with a viral illness. Old records are reviewed, and he does have a prior ED visit for palpitations. We'll check screening labs but anticipate sending home with  reassurance.  Chest x-ray is normal. CBC does show a right shift consistent with viral illness. Orthostatic vital signs showed no significant change in heart rate or blood pressure. Patient is reassured of benign nature of his condition and is instructed to follow-up with PCP as needed. Return nausea is discussed.  Final Clinical Impressions(s) / ED Diagnoses   Final diagnoses:  Palpitations  Malaise and fatigue    New Prescriptions New Prescriptions   No medications on file   I personally performed the services described in this documentation, which was scribed in my presence. The recorded information has been reviewed and is accurate.      [name], MD 01/30/17 [phone]

## 2017-01-30 NOTE — ED Triage Notes (Signed)
Patient arrives complaining of his "heart making noise". States he can hear it right now, but it is mild. When it gets worse patient endorses shortness of breath and "felt like I was gonna pass out". Also endorses dizziness at time of increased symptoms. Denies pain.

## 2017-01-31 MED FILL — $BYSTOLIC 5 MG TABLET: 5 | 30 days supply | Qty: 30 | Fill #4

## 2017-02-09 MED FILL — BYSTOLIC 5 MG TABLET: 5 | 180 days supply | Qty: 180 | Fill #5

## 2017-04-18 IMAGING — CR DG CHEST 2V
2 series · 2 of 2 positions shown · non-contrast
Comparison: 08/23/2013

CLINICAL DATA: Palpitations.

EXAM:
CHEST  2 VIEW

[chest pa]
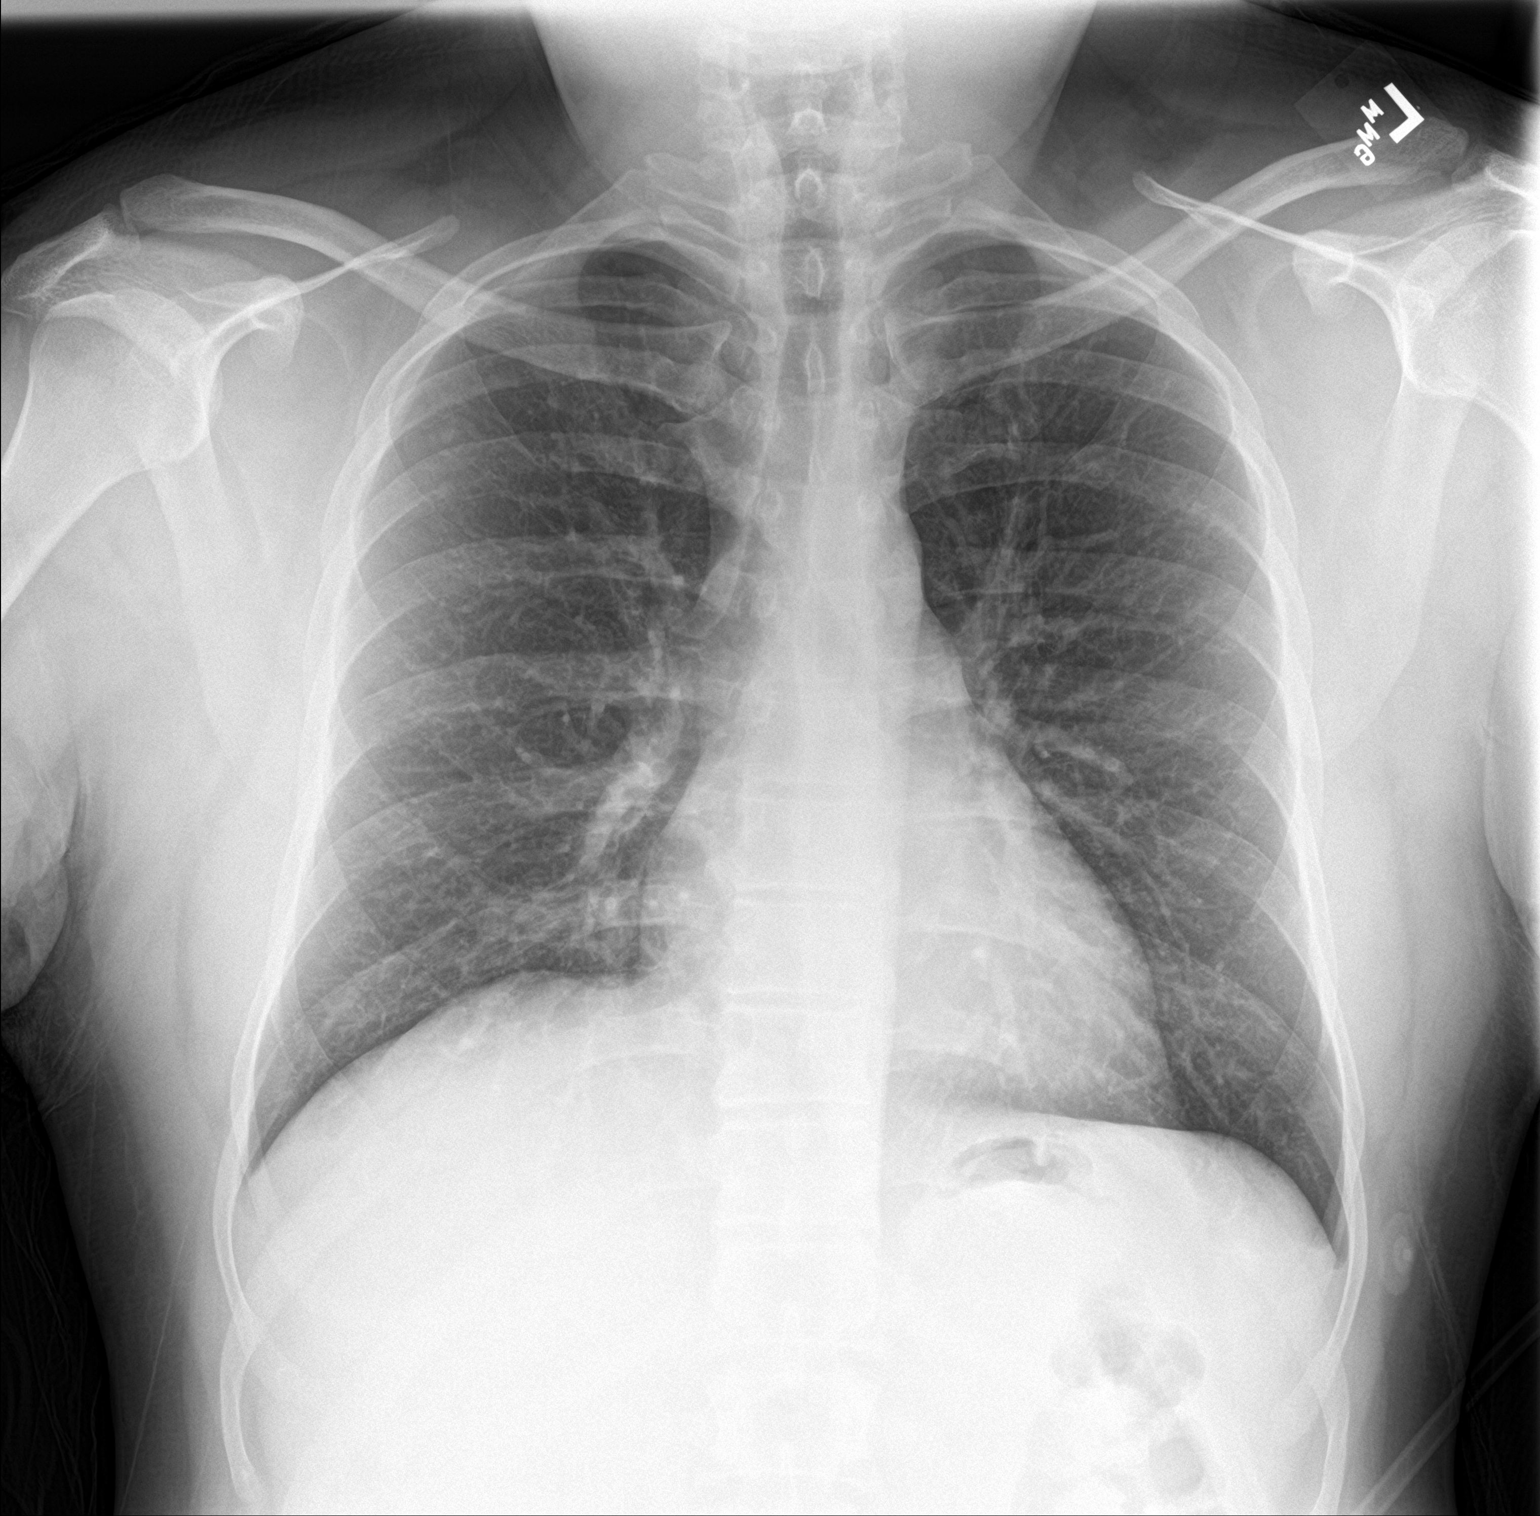

[chest lat]
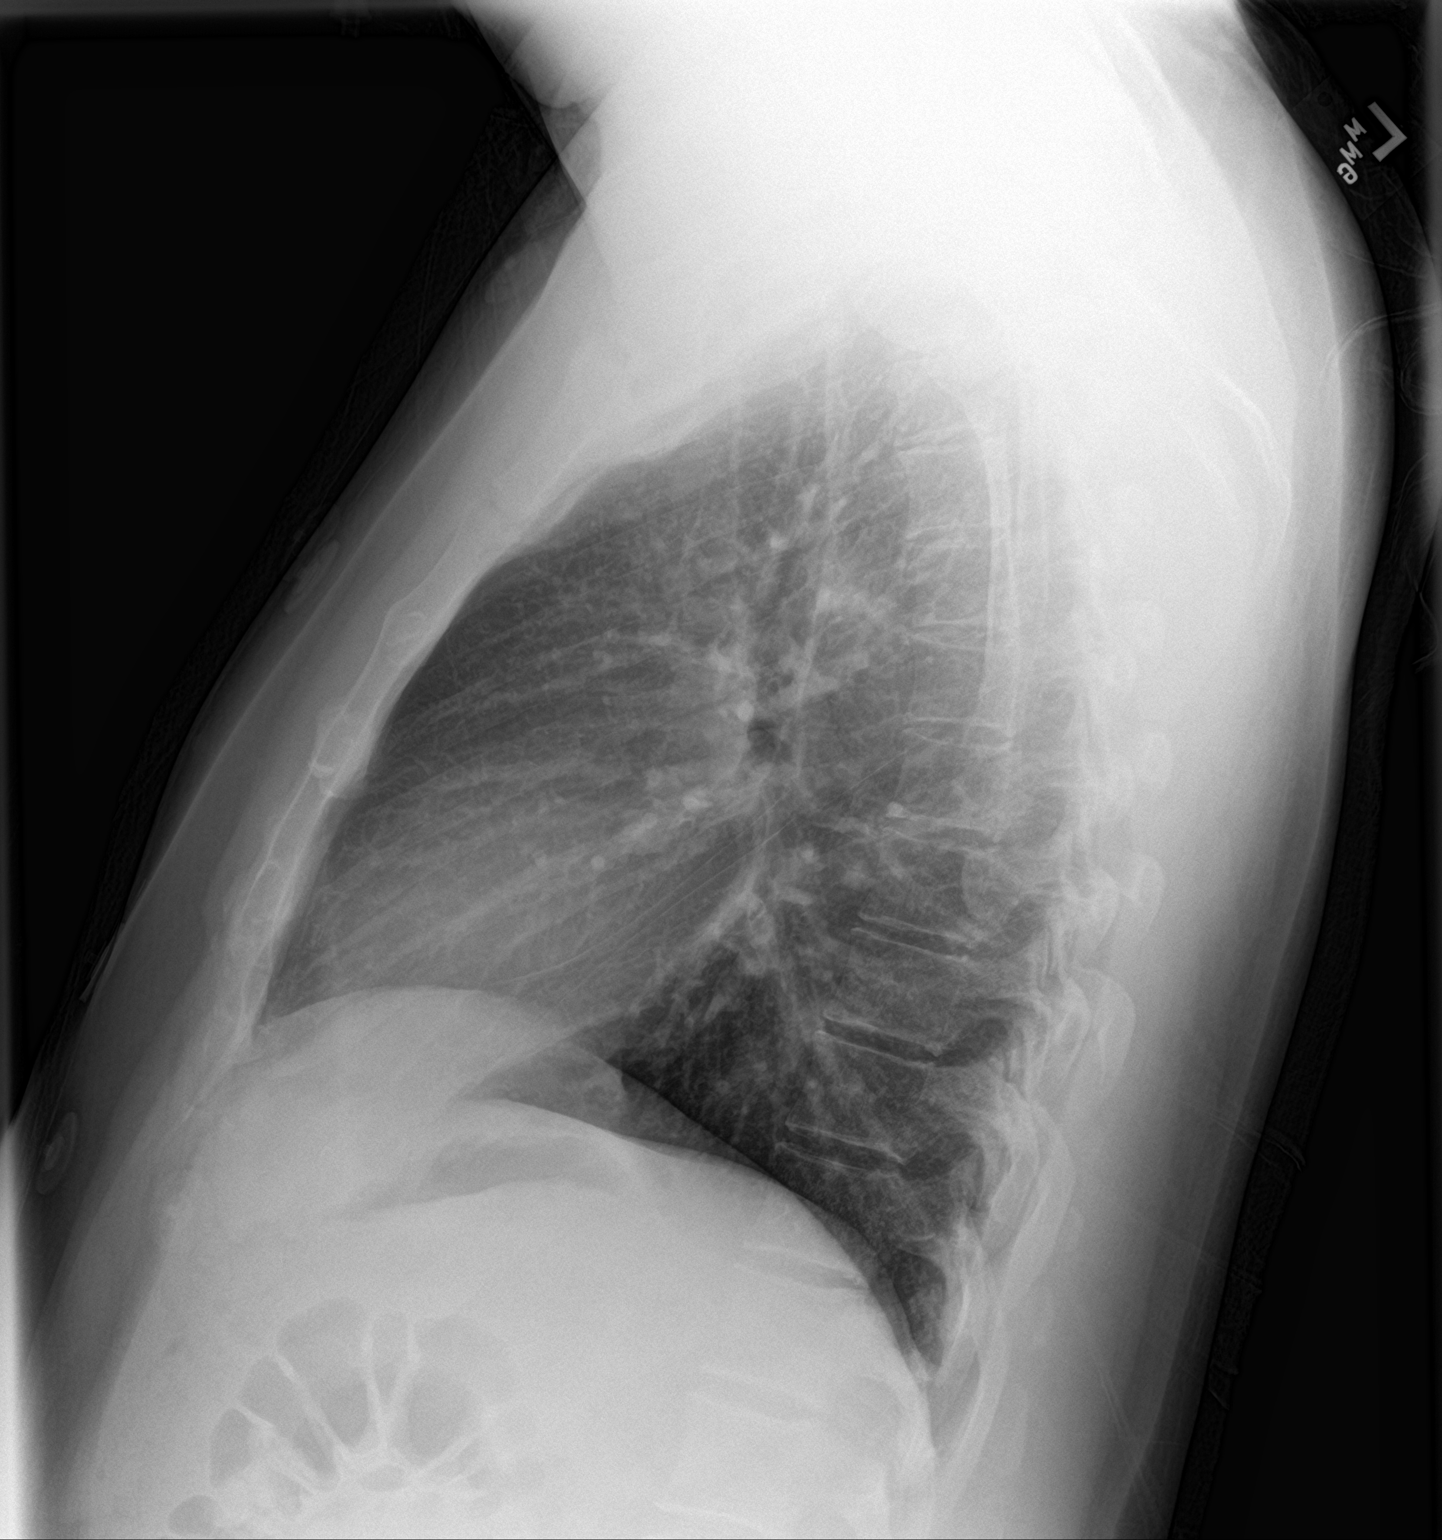

[2 of 2 positions shown; findings below may reference images not displayed]

FINDINGS: The cardiomediastinal contours are normal. The lungs are clear.
Pulmonary vasculature is normal. No consolidation, pleural effusion,
or pneumothorax. No acute osseous abnormalities are seen. Monitoring
lead projects over the right upper chest.
IMPRESSION: Clear lungs.  No acute abnormality.

## 2017-06-23 IMAGING — DX DG CHEST 2V
2 series · 2 of 2 positions shown · non-contrast
Comparison: 11/25/2016

CLINICAL DATA: Dyspnea tonight.

EXAM:
CHEST  2 VIEW

[chest pa]
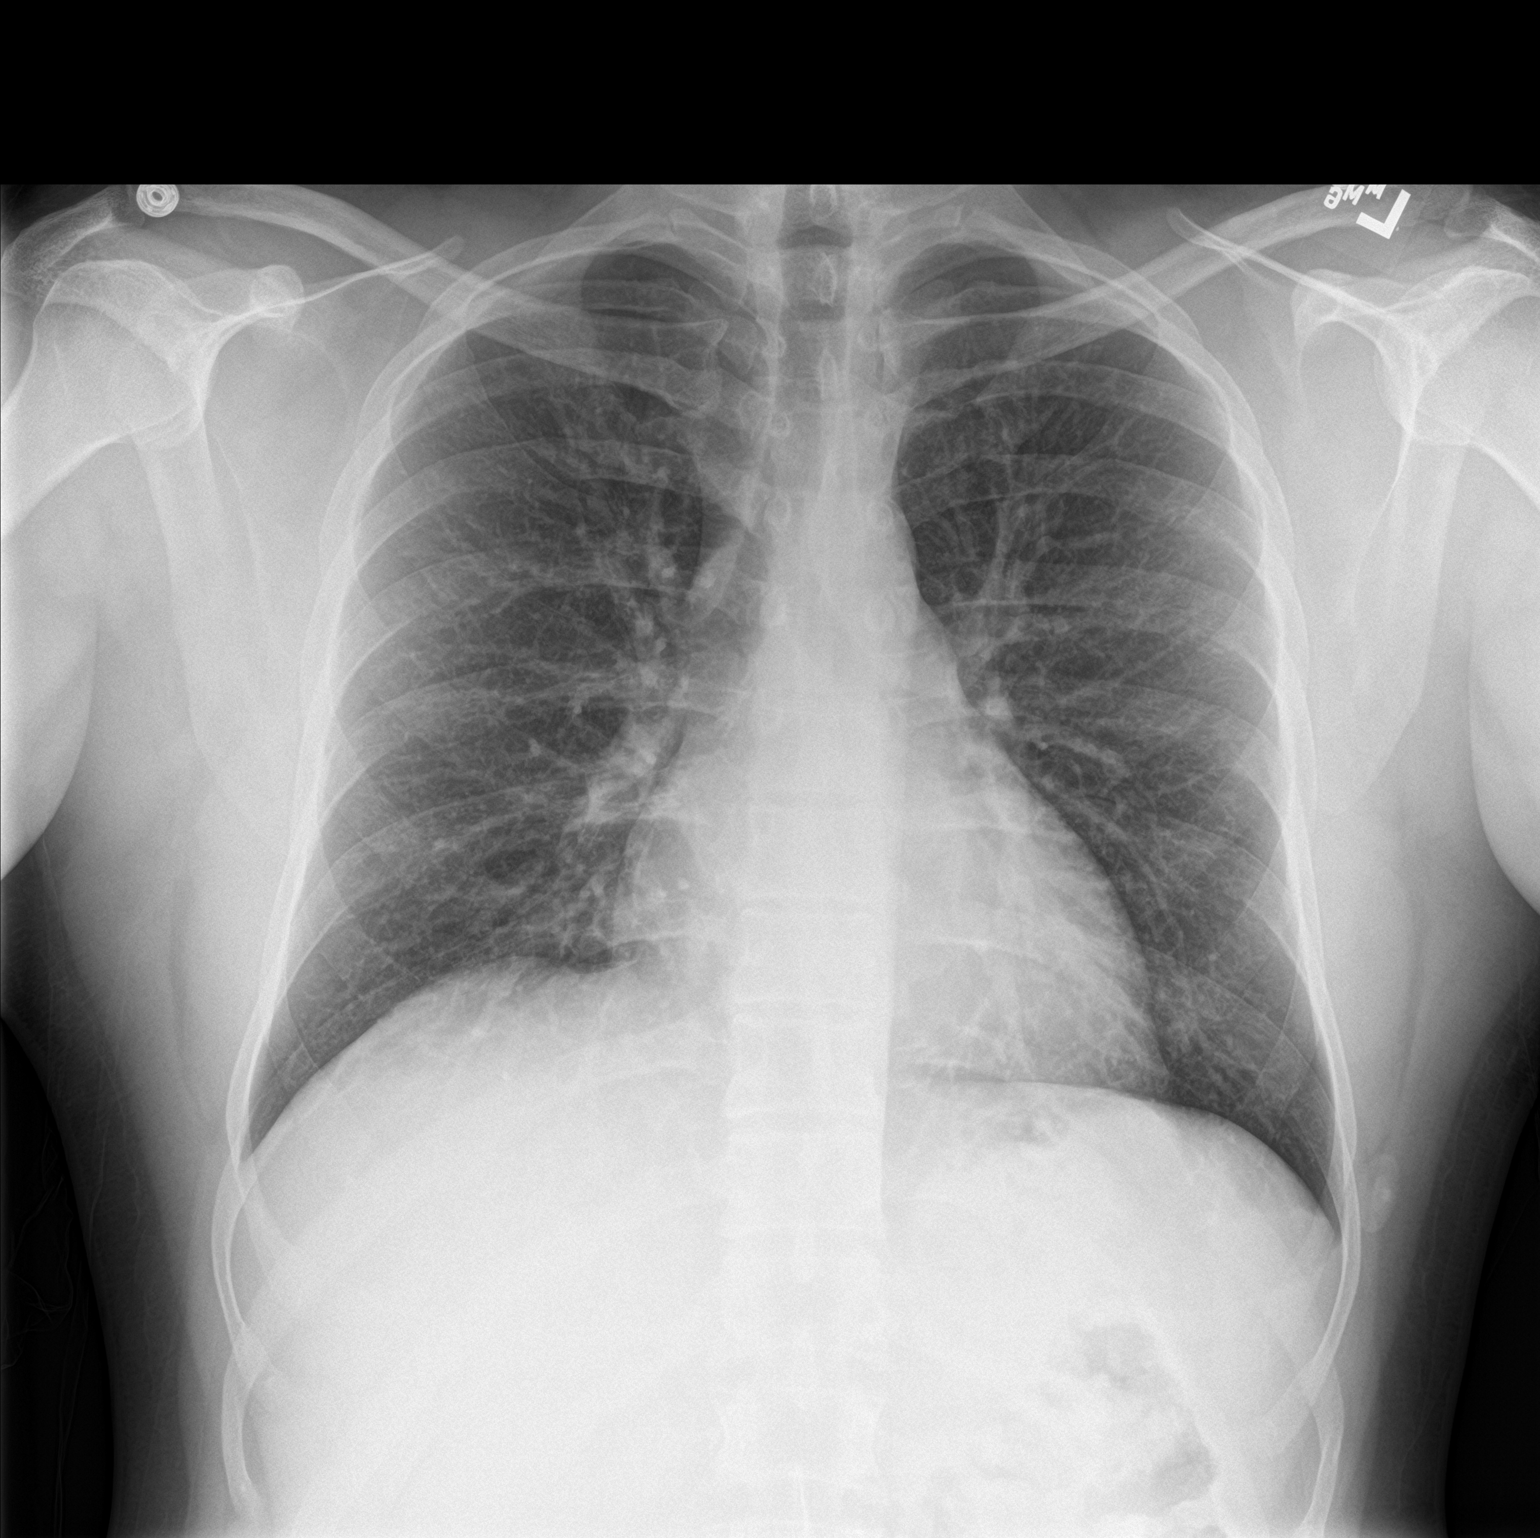

[chest lat]
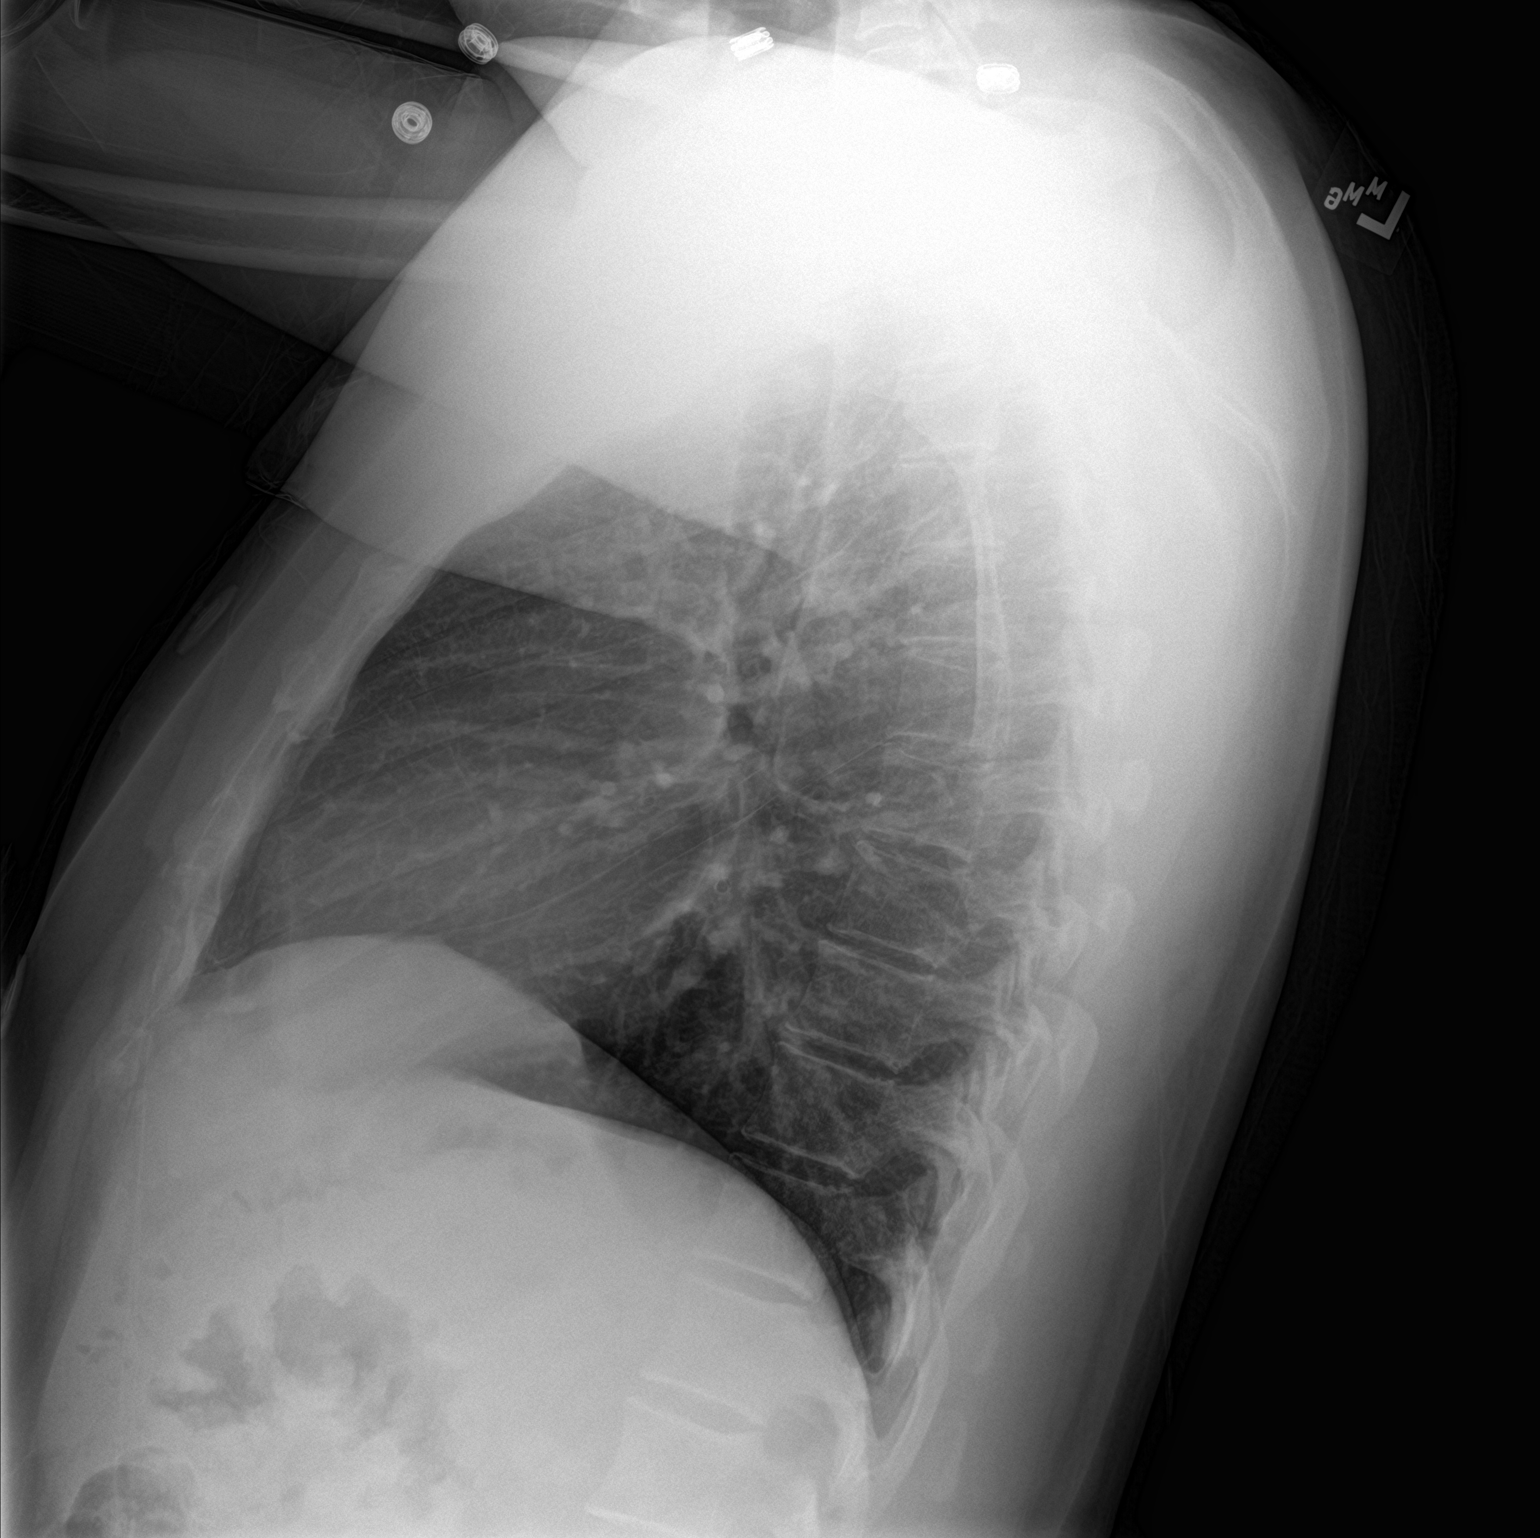

[2 of 2 positions shown; findings below may reference images not displayed]

FINDINGS: The heart size and mediastinal contours are within normal limits.
Both lungs are clear. The visualized skeletal structures are
unremarkable.
IMPRESSION: No active cardiopulmonary disease.

## 2017-07-04 ENCOUNTER — Other Ambulatory Visit: Payer: Self-pay | Admitting: Internal Medicine

## 2017-07-04 DIAGNOSIS — I10 Essential (primary) hypertension: Secondary | ICD-10-CM

## 2017-07-04 MED FILL — $BYSTOLIC 5 MG TABLET: 5 | 30 days supply | Qty: 30 | Fill #6

## 2017-08-02 MED FILL — $BYSTOLIC 5 MG TABLET: 5 | 30 days supply | Qty: 30 | Fill #0

## 2017-08-30 MED FILL — $BYSTOLIC 5 MG TABLET: 5 | 30 days supply | Qty: 30 | Fill #1

## 2017-09-29 MED FILL — $BYSTOLIC 5 MG TABLET: 5 | 30 days supply | Qty: 30 | Fill #2

## 2017-11-07 MED FILL — $BYSTOLIC 5 MG TABLET: 5 | 30 days supply | Qty: 30 | Fill #3

## 2017-12-07 MED FILL — $BYSTOLIC 5 MG TABLET: 5 | 30 days supply | Qty: 30 | Fill #4

## 2018-01-09 MED FILL — $BYSTOLIC 5 MG TABLET: 5 | 30 days supply | Qty: 30 | Fill #5

## 2018-01-30 ENCOUNTER — Other Ambulatory Visit: Payer: Self-pay | Admitting: Internal Medicine

## 2018-01-30 DIAGNOSIS — I10 Essential (primary) hypertension: Secondary | ICD-10-CM

## 2018-01-30 MED FILL — $BYSTOLIC 5 MG TABLET: 5 | 30 days supply | Qty: 30 | Fill #0

## 2018-03-08 ENCOUNTER — Other Ambulatory Visit: Payer: Self-pay | Admitting: Internal Medicine

## 2018-03-08 DIAGNOSIS — I10 Essential (primary) hypertension: Secondary | ICD-10-CM

## 2018-05-25 ENCOUNTER — Telehealth: Payer: Self-pay | Admitting: Internal Medicine

## 2018-05-25 DIAGNOSIS — I10 Essential (primary) hypertension: Secondary | ICD-10-CM

## 2018-05-25 NOTE — Telephone Encounter (Signed)
[place] stated [place] and would like to request for 2 months of bp medication until he gets home. Patient stated he would get his cousin to pick it up for him and [place] it to him [place].  BYSTOLIC 5 MG tablet [[id number]][[id number]]  [facility]   [phone] Tommy Medalasser Issa okay to contact regardring matter.

## 2018-05-26 MED ORDER — NEBIVOLOL HCL 5 MG PO TABS: 5.0000 mg | ORAL_TABLET | Freq: Every day | ORAL | 1 refills | Status: DC

## 2018-05-26 MED FILL — BYSTOLIC 5 MG TABLET: 5 | 60 days supply | Qty: 60 | Fill #0

## 2018-05-26 NOTE — Telephone Encounter (Signed)
Attempted to contact number that was provided. Mailbox is full.

## 2019-02-26 NOTE — Telephone Encounter (Signed)
Please advise on this concern. Patient has not been seen since 12/2016. Patient called 05/2018 with the same concern and still has not been seen in office to reestablish.

## 2019-03-05 NOTE — Telephone Encounter (Signed)
Patient mychart response for BP needs [place]

## 2019-07-06 ENCOUNTER — Telehealth: Payer: Self-pay

## 2019-07-06 NOTE — Telephone Encounter (Signed)
Hi my name is Vincent Griffith I have been taking atenalol 50 mg instead of bystolic 5 mg as [place] am stock [place] and airport closed   And thank you everything seems fine ,however I have a simple question I take 50mg  atenalol 50 one time in the morning for three months but still feel tired and fast heart beat and would like to increase the dose a little bit the question can I take 50mg  in the morning and 25mg  at evening or it has to be equal 50mg  in the morning and 50mg  in the evening ,thanks...   Patient has not been seen since 2018 newlin spoke with him via mychart back in April of 2020

## 2019-07-06 NOTE — Telephone Encounter (Signed)
Tried contacting pt to go over Dr. Wynetta Emery message. Pt didn't answer and was unable to lvm due to "call can't be completed at this time"

## 2019-11-14 ENCOUNTER — Other Ambulatory Visit: Payer: Self-pay | Admitting: Family Medicine

## 2019-11-14 DIAGNOSIS — I10 Essential (primary) hypertension: Secondary | ICD-10-CM

## 2019-11-14 MED ORDER — NEBIVOLOL HCL 5 MG PO TABS: 5.0000 mg | ORAL_TABLET | Freq: Every day | ORAL | 1 refills | Status: DC

## 2019-11-15 MED FILL — BYSTOLIC 5 MG TABLET: 5 | 30 days supply | Qty: 30 | Fill #0

## 2019-12-25 MED FILL — BYSTOLIC 5 MG TABLET: 5 | 30 days supply | Qty: 30 | Fill #1

## 2020-01-29 MED FILL — BYSTOLIC 5 MG TABLET: 5 | 30 days supply | Qty: 30 | Fill #2

## 2021-05-26 ENCOUNTER — Ambulatory Visit: Payer: Self-pay | Admitting: *Deleted

## 2021-05-26 ENCOUNTER — Telehealth: Payer: Self-pay | Admitting: Family Medicine

## 2021-05-26 NOTE — Telephone Encounter (Signed)
Pt spoke to agent regarding lab work and refill of Bystolic. Pt is presently in [place], will return in 10 days. Pt mentioned SOB to agent, transferred to NT. Pt reports SOB from "Change in medication" States "This happens when I'm out of this medication." Pt reluctant to triage, "Sensitive matter, my wife is here next to me." Assured pt issue with labs and medication would be reviewed. Advised ED if SOB worsens. Pt verbalizes understanding.        Reason for Disposition  [1] MILD longstanding difficulty breathing AND [2]  SAME as normal  Answer Assessment - Initial Assessment Questions 1. RESPIRATORY STATUS: "Describe your breathing?" (e.g., wheezing, shortness of breath, unable to speak, severe coughing)       2. ONSET: "When did this breathing problem begin?"      *No Answer* 3. PATTERN "Does the difficult breathing come and go, or has it been constant since it started?"      *No Answer* 4. SEVERITY: "How bad is your breathing?" (e.g., mild, moderate, severe)    - MILD: No SOB at rest, mild SOB with walking, speaks normally in sentences, can lie down, no retractions, pulse < 100.    - MODERATE: SOB at rest, SOB with minimal exertion and prefers to sit, cannot lie down flat, speaks in phrases, mild retractions, audible wheezing, pulse 100-120.    - SEVERE: Very SOB at rest, speaks in single words, struggling to breathe, sitting hunched forward, retractions, pulse > 120      *No Answer* 5. RECURRENT SYMPTOM: "Have you had difficulty breathing before?" If Yes, ask: "When was the last time?" and "What happened that time?"      *No Answer* 6. CARDIAC HISTORY: "Do you have any history of heart disease?" (e.g., heart attack, angina, bypass surgery, angioplasty)      *No Answer* 7. LUNG HISTORY: "Do you have any history of lung disease?"  (e.g., pulmonary embolus, asthma, emphysema)     *No Answer* 8. CAUSE: "What do you think is causing the breathing problem?"      *No Answer* 9. OTHER  SYMPTOMS: "Do you have any other symptoms? (e.g., dizziness, runny nose, cough, chest pain, fever)     *No Answer* 10. O2 SATURATION MONITOR:  "Do you use an oxygen saturation monitor (pulse oximeter) at home?" If Yes, "What is your reading (oxygen level) today?" "What is your usual oxygen saturation reading?" (e.g., 95%)       *No Answer* 11. PREGNANCY: "Is there any chance you are pregnant?" "When was your last menstrual period?"       *No Answer* 12. TRAVEL: "Have you traveled out of the country in the last month?" (e.g., travel history, exposures)       *No Answer*  Protocols used: Breathing Difficulty-A-AH

## 2021-05-26 NOTE — Telephone Encounter (Signed)
Called patient but I was not able to contact him. Patient has not been seen since 2018 and needs to schedule an appointment.

## 2021-05-26 NOTE — Telephone Encounter (Signed)
Pt called reporting that his Bystolic just ran out and that he has been taking Bysoprol as an alternative and that is not helping either. Please advise, pt is aware that he needs lab work for refills but [place] of his father. He is requesting a call back to discuss options of what he can do to have lab work the office can use.   Best contact: [phone]  Says you can respond via MyChart   Pt mentioned having SOB - transferred to nurse triage

## 2021-05-29 ENCOUNTER — Other Ambulatory Visit: Payer: Self-pay

## 2021-05-29 ENCOUNTER — Other Ambulatory Visit: Payer: Self-pay | Admitting: Family Medicine

## 2021-05-29 DIAGNOSIS — I10 Essential (primary) hypertension: Secondary | ICD-10-CM

## 2021-05-29 NOTE — Telephone Encounter (Signed)
  Notes to clinic:  script requested has expired  Review for continued use and refill  Message sent to patient to contact office   Requested Prescriptions  Pending Prescriptions Disp Refills   nebivolol (BYSTOLIC) 5 MG tablet 90 tablet 1    Sig: Take 1 tablet (5 mg total) by mouth daily.      Cardiovascular:  Beta Blockers Failed - 05/29/2021 10:29 AM      Failed - Last BP in normal range    BP Readings from Last 1 Encounters:  01/30/17 138/83          Failed - Valid encounter within last 6 months    Recent Outpatient Visits           4 years ago Generalized anxiety disorder   [facility] [place], Phylliss Blakes, MD   4 years ago Needs flu shot   [facility] [place], Phylliss Blakes, MD   7 years ago Palpitations   [facility] Quentin Angst, MD   7 years ago    [facility]   7 years ago Chest pain   [facility] [place], Wendi Maya, MD                Passed - Last Heart Rate in normal range    Pulse Readings from Last 1 Encounters:  01/30/17 72

## 2021-06-02 ENCOUNTER — Telehealth: Payer: Self-pay | Admitting: Family Medicine

## 2021-06-02 ENCOUNTER — Other Ambulatory Visit: Payer: Self-pay

## 2021-06-02 ENCOUNTER — Ambulatory Visit: Payer: Self-pay

## 2021-06-02 DIAGNOSIS — I10 Essential (primary) hypertension: Secondary | ICD-10-CM

## 2021-06-02 MED ORDER — NEBIVOLOL HCL 5 MG PO TABS
5.0000 mg | ORAL_TABLET | Freq: Every day | ORAL | 0 refills | Status: DC
  Filled 2021-06-02: qty 30, 30d supply, fill #0

## 2021-06-02 NOTE — Telephone Encounter (Signed)
[phone] Pt returned call to office requesting to speak to the clinic regarding this refill request. Please advise, tried calling office.

## 2021-06-02 NOTE — Telephone Encounter (Signed)
Please disregard, in error.

## 2021-06-02 NOTE — Telephone Encounter (Addendum)
Upon chart review pt needs to make appointment. Called and LM on the VM at [facility] to cancel that refill. Called pt back [phone] and informed him he must make a virtual appointment. Last appt was 01/12/17. Attempted to make appt but no virtual visits available until 07/2021. It has been 4 years since last appt. Routing this to office and have also sent Mallory a team message regarding this matter.

## 2021-06-02 NOTE — Telephone Encounter (Signed)
Reason for Disposition . [1] Prescription refill request for ESSENTIAL medicine (i.e., likelihood of harm to patient if not taken) AND [2] triager unable to refill per department policy  Answer Assessment - Initial Assessment Questions 1. DRUG NAME: "What medicine do you need to have refilled?"     Bystolic 2. REFILLS REMAINING: "How many refills are remaining?" (Note: The label on the medicine or pill bottle will show how many refills are remaining. If there are no refills remaining, then a renewal may be needed.)     none 3. EXPIRATION DATE: "What is the expiration date?" (Note: The label states when the prescription will expire, and thus can no longer be refilled.)     Did not ask 4. PRESCRIBING HCP: "Who prescribed it?" Reason: If prescribed by specialist, call should be referred to that group.     Dr Wiliam Ke 5. SYMPTOMS: "Do you have any symptoms?"  After starting Bisoprolol: Sweating,headache, skipping heart beat on every 10 th beat, weakness SOB with activity, dizziness  Protocols used: Medication Refill and Renewal Call-A-AH

## 2021-06-02 NOTE — Telephone Encounter (Signed)
Sx started 1 week ago when Bystolic ran out and was prescribed Bisprolol. Pt began having sx the next day after running out of Bystolic. SX headache, skipping heart beat on every 10 th beat, depressed, weakness SOB with activity, dizziness. Pt at [place]. Saralyn Pilar [phone] will pick up med Pt asking for 90 day refill. Pt will be in [place] 3 months. Advised pt with Dr Alvis Lemmings when back in [place].

## 2021-06-02 NOTE — Telephone Encounter (Signed)
Medication: nebivolol (BYSTOLIC) 5 MG tablet [[id number]] -requesting 90 day script  Has the patient contacted their pharmacy? YES  (Agent: If no, request that the patient contact the pharmacy for the refill.) (Agent: If yes, when and what did the pharmacy advise?)  Preferred Pharmacy (with phone number or street name): [facility] [address] [place] [address] Phone: [phone] Fax: [phone] Hours: M-F 8:30a-5:30p    Agent: Please be advised that RX refills may take up to 3 business days. We ask that you follow-up with your pharmacy.

## 2021-06-02 NOTE — Addendum Note (Signed)
Addended by: Carlean Purl on: 06/02/2021 05:18 PM   Modules accepted: Orders

## 2021-06-03 ENCOUNTER — Other Ambulatory Visit: Payer: Self-pay

## 2021-06-03 NOTE — Telephone Encounter (Signed)
Last appt was 2018, need to see a provider to get refill

## 2021-06-15 ENCOUNTER — Other Ambulatory Visit: Payer: Self-pay

## 2021-07-13 ENCOUNTER — Telehealth: Payer: Self-pay

## 2021-07-13 NOTE — Telephone Encounter (Signed)
Pt texted me stating he needs a refill of his Bystolic. " Helo doctor Huntley Dec his is Vincent Griffith I am trying again for my medicine refill I would sayI give another attempt for refill Bystolic 5 mg. I am really disable from being to travel that distance for that blood test to allow me get the medicine so can I make those tests here and email them to you I am really so depressed with bisuprolol 5 mg that I am taking instead of bystolic and the headache is pounding my head every day. Why not. When we can work it in easy way and avoid that hardness of coming to you that I really started thinking of travel 10 thousand of mile for that medicine because it is worth it when I keep feeling that shortness of breath. I am disabled person right now not working and not able to do so I got to leave with my family so they can take care of me that is the reason I am here we need to do something with that pollacy when it is going against [place]. Please take my word."  Pt texted this to me. I do not remember the circumstance that I gave him my cell. I received text today at 1427. Please see note from July. Pt has not been in office since 2018. Pt is in [place]. Before he went to [place] he was trying to get a refill of Bystolic. Needed an OV but was unable to come.  Sending this as an Financial planner.

## 2021-07-14 NOTE — Telephone Encounter (Signed)
Unfortunately he needs to establish with a PCP in [place] who will manage his Hypertension. I have never seen him before but have given several courtesy refills due to his being a previous patient of the Clinic.

## 2021-07-20 ENCOUNTER — Telehealth: Payer: Self-pay | Admitting: *Deleted

## 2021-07-20 NOTE — Telephone Encounter (Signed)
Copied from CRM [id number]. Topic: General - Other >> Jul 13, 2021  3:17 PM Gaetana Michaelis A wrote: Reason for CRM: Patient would like to be contacted about their nebivolol (BYSTOLIC) 5 MG tablet prescription   The patient has previously spoken with a member of [facility] regarding their prescription refill and shares that they are unable to come into the office for a medication follow up appt  The patient is currently experiencing transportation difficulties due to their disability  The patient would like to be contacted by a member of clinical staff to discuss this further when possible

## 2021-07-20 NOTE — Telephone Encounter (Signed)
Patient needs to schedule an appointment

## 2021-07-21 ENCOUNTER — Encounter: Payer: Self-pay | Admitting: Physician Assistant

## 2021-07-21 ENCOUNTER — Other Ambulatory Visit: Payer: Self-pay

## 2021-07-21 NOTE — Progress Notes (Signed)
Pt is currently in the country of [place]. He is requesting a refill of his BP medication. Per chart review, pt has not been seen by PCP in 4 years and was instructed to complete a video visit prior to any refills being given. I explain to pt that this visit is not with his PCP but with an outside group and clarified that I am unable to legally treat him given he is currently outside of the [place]. I advised him to call his PCP office for further guidance.

## 2021-07-23 ENCOUNTER — Other Ambulatory Visit: Payer: Self-pay

## 2021-07-23 ENCOUNTER — Ambulatory Visit: Payer: Self-pay | Attending: Nurse Practitioner | Admitting: Family Medicine

## 2021-07-23 NOTE — Telephone Encounter (Signed)
This patient was scheduled for a virtual visit with me today however he initially informed me he was in [place] when I asked where he was and then later admitted that he lived in [place]. I had previously declined refills for him because he was last seen in the clinic 4 years ago and currently lives in [place] and I had given him courtesy refills based on the fact that he was a previous Dr. Hyman Hopes patient.  Please review my previous note on 07/13/21 where I had advised him to establish with a PCP in [place]. DJ I am unsure if this visit is billable. I will also need to check guidelines with the Board because we should not be having Telehealth visits with patients in [place] not to talk of patients outside the country.

## 2021-07-23 NOTE — Progress Notes (Signed)
   Virtual Visit via Video Note  I connected with Vincent Griffith, on 07/23/2021 at 10:04 AM by video enabled telemedicine device due to the COVID-19 pandemic and verified that I am speaking with the correct person using two identifiers.   Consent: I discussed the limitations, risks, security and privacy concerns of performing an evaluation and management service by telemedicine and the availability of in person appointments. I also discussed with the patient that there may be a patient responsible charge related to this service. The patient expressed understanding and agreed to proceed.   Location of Patient: [place]  Location of Provider: Clinic   Persons participating in Telemedicine visit: Vincent Griffith Dr. Alvis Lemmings     History of Present Illness: Vincent Griffith is a 40 y.o. year old male . This is my initial encounter with him .He initially informed me he was in [place] and on further questioning I discovered he had lied and was actually out of the Country in [place]. He has not been seen in the Clinic in the last 4 years (a previous patient of Dr Griffith Meckel) and had been receiving courtesy refills of [employer] even while out of the [employer]. I informed him that unfortunately I am unable to provide care to a patient outside of the [place] and he needs to get established with a PCP in [place]. If he returns to [facility], I will be happy to provide Primary Care Services to him.      [name], MD, FAAFP. [facility] [place], [place] [phone]   07/23/2021, 10:04 AM

## 2021-07-28 ENCOUNTER — Telehealth: Payer: Self-pay | Admitting: Nurse Practitioner

## 2021-11-09 ENCOUNTER — Encounter: Payer: Self-pay | Admitting: Family Medicine

## 2022-07-13 ENCOUNTER — Encounter: Payer: Self-pay | Admitting: Family Medicine

## 2023-02-21 ENCOUNTER — Other Ambulatory Visit (HOSPITAL_COMMUNITY): Payer: Self-pay

## 2024-06-27 ENCOUNTER — Other Ambulatory Visit (HOSPITAL_COMMUNITY): Payer: Self-pay
